# Patient Record
Sex: Female | Born: 1975
Health system: Southern US, Community
[De-identification: ages and names within clinical notes are randomized; demographics above are authoritative.]

## PROBLEM LIST (undated history)

## (undated) DIAGNOSIS — E785 Hyperlipidemia, unspecified: Secondary | ICD-10-CM

## (undated) HISTORY — DX: Hyperlipidemia, unspecified: E78.5

## (undated) HISTORY — PX: NO PAST SURGERIES: SHX2092

---

## 2016-11-07 DIAGNOSIS — L239 Allergic contact dermatitis, unspecified cause: Secondary | ICD-10-CM | POA: Diagnosis not present

## 2016-11-18 DIAGNOSIS — Z0000 Encounter for general adult medical examination without abnormal findings: Secondary | ICD-10-CM | POA: Diagnosis not present

## 2018-01-31 DIAGNOSIS — Z23 Encounter for immunization: Secondary | ICD-10-CM | POA: Diagnosis not present

## 2018-03-24 ENCOUNTER — Encounter: Payer: Self-pay | Admitting: Obstetrics and Gynecology

## 2018-04-04 ENCOUNTER — Encounter: Payer: Self-pay | Admitting: Sports Medicine

## 2018-04-04 ENCOUNTER — Ambulatory Visit (INDEPENDENT_AMBULATORY_CARE_PROVIDER_SITE_OTHER): Payer: BLUE CROSS/BLUE SHIELD | Admitting: Sports Medicine

## 2018-04-04 DIAGNOSIS — M722 Plantar fascial fibromatosis: Secondary | ICD-10-CM | POA: Diagnosis not present

## 2018-04-04 MED ORDER — MELOXICAM 15 MG PO TABS: ORAL_TABLET | ORAL | 3 refills | Status: DC

## 2018-04-04 NOTE — Progress Notes (Signed)
Subjective:    CC: Right foot pain  HPI:  This is a pleasant 42 year old female, she has been trying to run more more.  She does walk barefoot at home, for the past few weeks to months she is starting to have pain in the plantar aspect of her right heel, moderate, persistent, localized without radiation, not necessarily worse with the first few steps in the morning.  I reviewed the past medical history, family history, social history, surgical history, and allergies today and no changes were needed.  Please see the problem list section below in epic for further details.  Past Medical History: History reviewed. No pertinent past medical history. Past Surgical History: Past Surgical History:  Procedure Laterality Date  . NO PAST SURGERIES     Social History: Social History   Socioeconomic History  . Marital status: Married    Spouse name: Not on file  . Number of children: Not on file  . Years of education: Not on file  . Highest education level: Not on file  Occupational History  . Not on file  Social Needs  . Financial resource strain: Not on file  . Food insecurity:    Worry: Not on file    Inability: Not on file  . Transportation needs:    Medical: Not on file    Non-medical: Not on file  Tobacco Use  . Smoking status: Never Smoker  . Smokeless tobacco: Never Used  Substance and Sexual Activity  . Alcohol use: Not Currently  . Drug use: Never  . Sexual activity: Yes  Lifestyle  . Physical activity:    Days per week: Not on file    Minutes per session: Not on file  . Stress: Not on file  Relationships  . Social connections:    Talks on phone: Not on file    Gets together: Not on file    Attends religious service: Not on file    Active member of club or organization: Not on file    Attends meetings of clubs or organizations: Not on file    Relationship status: Not on file  Other Topics Concern  . Not on file  Social History Narrative  . Not on file    Family History: Family History  Problem Relation Age of Onset  . Cancer Maternal Uncle        colon  . Cancer Maternal Grandfather        kidney   Allergies: No Known Allergies Medications: See med rec.  Review of Systems: No headache, visual changes, nausea, vomiting, diarrhea, constipation, dizziness, abdominal pain, skin rash, fevers, chills, night sweats, swollen lymph nodes, weight loss, chest pain, body aches, joint swelling, muscle aches, shortness of breath, mood changes, visual or auditory hallucinations.  Objective:    General: Well Developed, well nourished, and in no acute distress.  Neuro: Alert and oriented x3, extra-ocular muscles intact, sensation grossly intact.  HEENT: Normocephalic, atraumatic, pupils equal round reactive to light, neck supple, no masses, no lymphadenopathy, thyroid nonpalpable.  Skin: Warm and dry, no rashes noted.  Cardiac: Regular rate and rhythm, no murmurs rubs or gallops.  Respiratory: Clear to auscultation bilaterally. Not using accessory muscles, speaking in full sentences.  Abdominal: Soft, nontender, nondistended, positive bowel sounds, no masses, no organomegaly.  Right foot: No visible erythema or swelling. Range of motion is full in all directions. Strength is 5/5 in all directions. No hallux valgus. No pes cavus or pes planus. No abnormal callus noted. No pain  over the navicular prominence, or base of fifth metatarsal. Mild tenderness to palpation of the calcaneal insertion of plantar fascia. No pain at the Achilles insertion. No pain over the calcaneal bursa. No pain of the retrocalcaneal bursa. No tenderness to palpation over the tarsals, metatarsals, or phalanges. No hallux rigidus or limitus. No tenderness palpation over interphalangeal joints. No pain with compression of the metatarsal heads. Neurovascularly intact distally.  Impression and Recommendations:    The patient was counselled, risk factors were discussed,  anticipatory guidance given.  Plantar fasciitis, right Meloxicam, rehab exercises given. Avoid barefoot walking. Return for custom molded orthotics. Currently stopped running but would like to get back up to 5 to 10 miles per week. ___________________________________________ Gwen Her. Dianah Field, M.D., ABFM., CAQSM. Primary Care and Sports Medicine [facility]  Adjunct Professor of [place] of [facility] of Medicine

## 2018-04-04 NOTE — Assessment & Plan Note (Signed)
Meloxicam, rehab exercises given. Avoid barefoot walking. Return for custom molded orthotics. Currently stopped running but would like to get back up to 5 to 10 miles per week.

## 2018-04-19 ENCOUNTER — Ambulatory Visit (INDEPENDENT_AMBULATORY_CARE_PROVIDER_SITE_OTHER): Payer: BLUE CROSS/BLUE SHIELD | Admitting: Sports Medicine

## 2018-04-19 ENCOUNTER — Encounter: Payer: Self-pay | Admitting: Sports Medicine

## 2018-04-19 DIAGNOSIS — M722 Plantar fascial fibromatosis: Secondary | ICD-10-CM | POA: Diagnosis not present

## 2018-04-19 NOTE — Progress Notes (Signed)
    Patient was fitted for a : standard, cushioned, semi-rigid orthotic. The orthotic was heated and afterward the patient stood on the orthotic blank positioned on the orthotic stand. The patient was positioned in subtalar neutral position and 10 degrees of ankle dorsiflexion in a weight bearing stance. After completion of molding, a stable base was applied to the orthotic blank. The blank was ground to a stable position for weight bearing. Size: 6 Base: [employer] and safety inspector and [place]adding: None The patient ambulated these, and they were very comfortable.  I spent 40 minutes with this patient, greater than 50% was face-to-face time counseling regarding the below diagnosis, specifically diagnosis and treatment options for plantar fasciitis.  ___________________________________________ Gwen Her. Dianah Field, M.D., ABFM., CAQSM. Primary Care and [place] Instructor of [place] of [facility] of Medicine

## 2018-04-19 NOTE — Assessment & Plan Note (Signed)
Continue meloxicam, avoid barefoot walking, custom orthotics today. Goal is to get back to 5 to 10 miles per week of running. Feeling better with the stretches and the meloxicam during the morning but significant pain during the day.

## 2018-04-22 ENCOUNTER — Other Ambulatory Visit: Payer: Self-pay

## 2018-04-22 ENCOUNTER — Other Ambulatory Visit (HOSPITAL_COMMUNITY)
Admission: RE | Admit: 2018-04-22 | Discharge: 2018-04-22 | Disposition: A | Discharge status: Home / Self Care | Payer: BLUE CROSS/BLUE SHIELD | Source: Ambulatory Visit | Attending: Obstetrics and Gynecology | Admitting: Obstetrics and Gynecology

## 2018-04-22 ENCOUNTER — Encounter: Payer: Self-pay | Admitting: Obstetrics and Gynecology

## 2018-04-22 ENCOUNTER — Ambulatory Visit (INDEPENDENT_AMBULATORY_CARE_PROVIDER_SITE_OTHER): Payer: BLUE CROSS/BLUE SHIELD | Admitting: Obstetrics and Gynecology

## 2018-04-22 VITALS — BP 108/60 | HR 72 | Resp 12 | Ht 62.0 in | Wt 126.0 lb

## 2018-04-22 DIAGNOSIS — D229 Melanocytic nevi, unspecified: Secondary | ICD-10-CM | POA: Diagnosis not present

## 2018-04-22 DIAGNOSIS — Z01419 Encounter for gynecological examination (general) (routine) without abnormal findings: Secondary | ICD-10-CM | POA: Diagnosis not present

## 2018-04-22 NOTE — Patient Instructions (Addendum)

## 2018-04-22 NOTE — Progress Notes (Signed)
43 y.o. G0P0000 Married Asian/Brazilian female here as a new patient for an annual exam. Patient moved from [place] April 2018.  No missed menses.  No hot flashes.  No pain with menstruation.   Hx low vit D.   Did 2 IVF in [place].  Declines future childbearing.   From [place].  Husband works for [employer]   PCP:   No PCP  Patient's last menstrual period was 04/01/2018.     Period Cycle (Days): 28 Period Duration (Days): 4-5 Period Pattern: Regular Menstrual Flow: Moderate Menstrual Control: Tampon, Maxi pad Menstrual Control Change Freq (Hours): 8 Dysmenorrhea: (!) Mild Dysmenorrhea Symptoms: (crave sweets, breast tenderness and back pain)     Sexually active: Yes.    The current method of family planning is none.    Exercising: Yes.    cycle class and orange theory Smoker:  no  Health Maintenance: Pap:  2017 done in [place] - normal per patient  History of abnormal Pap:  no MMG:  2017 normal per patient -- patient states that she has dense breasts Colonoscopy:  n/a BMD:   n/a  Result  n/a TDaP:  unsure Gardasil:   no HIV and Hep C: negative in the past per patient Screening Labs:  Discuss today.  Flu vaccine:  Done.    reports that she has never smoked. She has never used smokeless tobacco. She reports previous alcohol use. She reports that she does not use drugs.  History reviewed. No pertinent past medical history.  Past Surgical History:  Procedure Laterality Date  . NO PAST SURGERIES      Current Outpatient Medications  Medication Sig Dispense Refill  . CHOLECALCIFEROL PO Take by mouth. Take 10,000 IU twice weekly    . meloxicam (MOBIC) 15 MG tablet One tab PO qAM with breakfast for 2 weeks, then daily prn pain. 30 tablet 3  . Misc Natural Products (OSTEO BI-FLEX JOINT SHIELD PO) Take by mouth. Take 1500mg  once daily     No current facility-administered medications for this visit.     Family History  Problem Relation Age of Onset  . Cancer Maternal  Uncle        colon  . Cancer Maternal Grandfather        kidney  . Diabetes Mother   . Osteoporosis Mother   . Heart attack Father   . Hyperlipidemia Father   . Diabetes Father   . Breast cancer Paternal Aunt   . Aneurysm Paternal Uncle   . Brain cancer Maternal Grandmother   . Anuerysm Paternal Grandmother     Review of Systems  Constitutional: Negative.   HENT: Negative.   Eyes: Negative.   Respiratory: Negative.   Cardiovascular: Negative.   Gastrointestinal: Negative.   Endocrine: Negative.   Genitourinary: Negative.   Musculoskeletal: Negative.   Skin: Negative.   Allergic/Immunologic: Negative.   Neurological: Negative.   Hematological: Negative.   Psychiatric/Behavioral: Negative.     Exam:   BP 108/60 (BP Location: Right Arm, Patient Position: Sitting, Cuff Size: Normal)   Pulse 72   Resp 12   Ht 5\' 2"  (1.575 m)   Wt 126 lb (57.2 kg)   LMP 04/01/2018   BMI 23.05 kg/m     General appearance: alert, cooperative and appears stated age Head: Normocephalic, without obvious abnormality, atraumatic Neck: no adenopathy, supple, symmetrical, trachea midline and thyroid normal to inspection and palpation Lungs: clear to auscultation bilaterally Breasts: normal appearance, no masses or tenderness, No nipple retraction or dimpling,  No nipple discharge or bleeding, No axillary or supraclavicular adenopathy Heart: regular rate and rhythm Abdomen: soft, non-tender; no masses, no organomegaly Extremities: extremities normal, atraumatic, no cyanosis or edema Skin: Skin color, texture, turgor normal. No rashes or lesions Lymph nodes: Cervical, supraclavicular, and axillary nodes normal. No abnormal inguinal nodes palpated Neurologic: Grossly normal  Pelvic: External genitalia:   3 - 4 mm irregular raised pigmented nevus of the left vulva (new per patient).              Urethra:  normal appearing urethra with no masses, tenderness or lesions              Bartholins and  Skenes: normal                 Vagina: normal appearing vagina with normal color and discharge, no lesions              Cervix: no lesions              Pap taken: Yes.   Bimanual Exam:  Uterus:  normal size, contour, position, consistency, mobility, non-tender              Adnexa: no mass, fullness, tenderness              Rectal exam: Yes.  .  Confirms.              Anus:  normal sphincter tone, no lesions  Chaperone was present for exam.  Assessment:   Well woman visit with normal exam. Hx infertility.  Hx low vit D.  Pigmented nevus.   Plan: Mammogram screening.  Locations discussed.  Recommended self breast awareness. Pap and HR HPV as above. Guidelines for Calcium, Vitamin D, regular exercise program including cardiovascular and weight bearing exercise. Routine labs.  Discussed Gardasil vaccine.  She declines for now.  Return for vulvar biopsy.  Follow up annually and prn.   After visit summary provided.

## 2018-04-23 LAB — COMPREHENSIVE METABOLIC PANEL
ALT: 11 IU/L (ref 0–32)
AST: 16 IU/L (ref 0–40)
Albumin/Globulin Ratio: 2.1 (ref 1.2–2.2)
Albumin: 4.8 g/dL (ref 3.5–5.5)
Alkaline Phosphatase: 61 IU/L (ref 39–117)
BUN/Creatinine Ratio: 12 (ref 9–23)
BUN: 8 mg/dL (ref 6–24)
Bilirubin Total: 0.8 mg/dL (ref 0.0–1.2)
CO2: 17 mmol/L — ABNORMAL LOW (ref 20–29)
Calcium: 9.9 mg/dL (ref 8.7–10.2)
Chloride: 102 mmol/L (ref 96–106)
Creatinine, Ser: 0.68 mg/dL (ref 0.57–1.00)
GFR calc Af Amer: 124 mL/min/{1.73_m2} (ref >59)
GFR calc non Af Amer: 107 mL/min/{1.73_m2} (ref >59)
Globulin, Total: 2.3 g/dL (ref 1.5–4.5)
Glucose: 83 mg/dL (ref 65–99)
Potassium: 4 mmol/L (ref 3.5–5.2)
Sodium: 138 mmol/L (ref 134–144)
Total Protein: 7.1 g/dL (ref 6.0–8.5)

## 2018-04-23 LAB — LIPID PANEL
Chol/HDL Ratio: 2.3 ratio (ref 0.0–4.4)
Cholesterol, Total: 233 mg/dL — ABNORMAL HIGH (ref 100–199)
HDL: 102 mg/dL (ref >39)
LDL Calculated: 116 mg/dL — ABNORMAL HIGH (ref 0–99)
Triglycerides: 74 mg/dL (ref 0–149)
VLDL Cholesterol Cal: 15 mg/dL (ref 5–40)

## 2018-04-23 LAB — CBC
Hematocrit: 38.2 % (ref 34.0–46.6)
Hemoglobin: 12.5 g/dL (ref 11.1–15.9)
MCH: 29.8 pg (ref 26.6–33.0)
MCHC: 32.7 g/dL (ref 31.5–35.7)
MCV: 91 fL (ref 79–97)
Platelets: 301 10*3/uL (ref 150–450)
RBC: 4.19 x10E6/uL (ref 3.77–5.28)
RDW: 12.9 % (ref 12.3–15.4)
WBC: 8.3 10*3/uL (ref 3.4–10.8)

## 2018-04-23 LAB — TSH: TSH: 1.36 u[IU]/mL (ref 0.450–4.500)

## 2018-04-23 LAB — VITAMIN D 25 HYDROXY (VIT D DEFICIENCY, FRACTURES): Vit D, 25-Hydroxy: 32 ng/mL (ref 30.0–100.0)

## 2018-04-26 ENCOUNTER — Other Ambulatory Visit: Payer: Self-pay | Admitting: Obstetrics and Gynecology

## 2018-04-26 DIAGNOSIS — Z1231 Encounter for screening mammogram for malignant neoplasm of breast: Secondary | ICD-10-CM

## 2018-04-26 LAB — CYTOLOGY - PAP
Diagnosis: NEGATIVE
HPV: NOT DETECTED

## 2018-04-27 ENCOUNTER — Telehealth: Payer: Self-pay | Admitting: *Deleted

## 2018-04-27 NOTE — Telephone Encounter (Signed)
-----   Message from Nunzio Cobbs, MD sent at 04/26/2018  3:24 PM EST ----- Please contact patient with results of testing.  You may need an interpretor to assist.  You may try to call her first to ask if she needs an interpretor.  [place] is her primary language.   Her pap and high risk HPV testing are normal and negative.  Pap recall - 02.  Her cholesterol looks good.  The total cholesterol and the LDL cholesterol are elevated, but the ratios are excellent due to her high HDL cholesterol.     Her metabolic profile shows a slightly low CO2, which is not of concern.  The rest of the metabolic profile was normal.   Her thyroid, vit D, and blood counts are all normal.

## 2018-04-27 NOTE — Telephone Encounter (Signed)
Patient returning call to Jill, RN.  °

## 2018-04-27 NOTE — Telephone Encounter (Signed)
Spoke with patient, patient declines interpretor, will advise if needed after results reviewed. Advised as seen below per Dr. Quincy Simmonds. Patient verbalizes understanding and is agreeable.    Encounter closed.

## 2018-04-27 NOTE — Telephone Encounter (Signed)
Notes recorded by Burnice Logan, RN on 04/27/2018 at 10:27 AM EST No answer, automated message sates " wireless customer not available", no option to leave voicemail. ------

## 2018-05-02 ENCOUNTER — Ambulatory Visit
Admission: RE | Admit: 2018-05-02 | Discharge: 2018-05-02 | Disposition: A | Discharge status: Home / Self Care | Payer: BLUE CROSS/BLUE SHIELD | Source: Ambulatory Visit | Attending: Obstetrics and Gynecology | Admitting: Obstetrics and Gynecology

## 2018-05-02 DIAGNOSIS — Z1231 Encounter for screening mammogram for malignant neoplasm of breast: Secondary | ICD-10-CM

## 2018-05-04 ENCOUNTER — Other Ambulatory Visit: Payer: Self-pay | Admitting: Behavioral Health

## 2018-05-04 ENCOUNTER — Other Ambulatory Visit: Payer: Self-pay | Admitting: Obstetrics and Gynecology

## 2018-05-04 DIAGNOSIS — R928 Other abnormal and inconclusive findings on diagnostic imaging of breast: Secondary | ICD-10-CM

## 2018-05-06 ENCOUNTER — Other Ambulatory Visit: Payer: Self-pay

## 2018-05-06 ENCOUNTER — Ambulatory Visit (INDEPENDENT_AMBULATORY_CARE_PROVIDER_SITE_OTHER): Payer: BLUE CROSS/BLUE SHIELD | Admitting: Obstetrics and Gynecology

## 2018-05-06 ENCOUNTER — Encounter: Payer: Self-pay | Admitting: Obstetrics and Gynecology

## 2018-05-06 VITALS — BP 110/66 | HR 66 | Ht 62.0 in | Wt 125.6 lb

## 2018-05-06 DIAGNOSIS — Z01812 Encounter for preprocedural laboratory examination: Secondary | ICD-10-CM

## 2018-05-06 DIAGNOSIS — D229 Melanocytic nevi, unspecified: Secondary | ICD-10-CM

## 2018-05-06 DIAGNOSIS — D280 Benign neoplasm of vulva: Secondary | ICD-10-CM | POA: Diagnosis not present

## 2018-05-06 LAB — POCT URINE PREGNANCY: PREG TEST UR: NEGATIVE

## 2018-05-06 NOTE — Patient Instructions (Signed)
Vulva Biopsy, Care After  This sheet gives you information about how to care for yourself after your procedure. Your health care provider may also give you more specific instructions. If you have problems or questions, contact your health care provider.  What can I expect after the procedure?  After the procedure, it is common to have:   Slight bleeding from the biopsy site.   Discomfort at the biopsy site.  Follow these instructions at home:  Biopsy site care     Follow instructions from your health care provider about how to take care of your biopsy site. Make sure you:  ? Clean the area using water and mild soap twice a day or as told by your health care provider. Gently pat the area dry.  ? If you were prescribed an antibiotic ointment, apply it as told by your health care provider. Do not stop using the antibiotic even if your condition improves.  ? Take a warm water bath (sitz bath) as needed to help with pain and discomfort. A sitz bath is taken while you are sitting down. The water should only come up to your hips and should cover your buttocks.  ? Leave stitches (sutures), skin glue, or adhesive strips in place. These skin closures may need to stay in place for 2 weeks or longer. If adhesive strip edges start to loosen and curl up, you may trim the loose edges. Do not remove adhesive strips completely unless your health care provider tells you to do that.   Check your biopsy site every day for signs of infection. Check for:  ? More redness, swelling, or pain.  ? More fluid or blood.  ? Warmth.  ? Pus or a bad smell.   Do not rub the biopsy area after urinating. Gently pat the area dry or use a bottle filled with warm water (peri-bottle) to clean the area. Gently wipe from front to back.  Lifestyle   Wear loose, cotton underwear. Do not wear tight pants.   Do not use a tampon, douche, or put anything inside your vagina for at least 1 week or until your health care provider approves.   Do not have sex  for at least 1 week or until your health care provider approves.   Do not exercise, such as running or biking, until your health care provider approves.   Do not swim or use a hot tub until your health care provider approves. You may shower or take a sitz bath.  General instructions   Take over-the-counter and prescription medicines only as told by your health care provider.   Use a sanitary napkin until the bleeding stops.   Keep all follow-up visits as told by your health care provider. This is important.  Contact a health care provider if:   You have more redness, swelling, or pain around your biopsy site.   You have more fluid or blood coming from your biopsy site.   Your biopsy site feels warm to the touch.   Your pain is not controlled with medicine.  Get help right away if you have:   Heavy bleeding from the vulva.   Pus or a bad smell coming from your biopsy site.   A fever.   Lower abdominal pain.  Summary   After the procedure, it is common to have slight bleeding and discomfort at the biopsy site.   Follow instructions from your health care provider after your biopsy. Make sure you clean the area with   water and mild soap. Pat the area dry.   Take sitz baths as needed to help with pain and discomfort. Leave any sutures in place.   Check your biopsy site for signs of infection, which may include more redness, swelling, pain, fluid, or blood, or feeling warm to the touch.   Get help right away if you have heavy bleeding, a fever, pus or a bad smell, or pain in the lower abdomen.  This information is not intended to replace advice given to you by your health care provider. Make sure you discuss any questions you have with your health care provider.  Document Released: 03/23/2012 Document Revised: 10/07/2017 Document Reviewed: 10/07/2017  Elsevier Interactive Patient Education  2019 Elsevier Inc.

## 2018-05-06 NOTE — Progress Notes (Signed)
  Subjective:     Patient ID: Heidi Daniels, female   DOB: 1975/04/26, 43 y.o.   MRN: [id number]  HPI  Patient here today for vulvar biopsy of pigmented nevus.   Review of Systems  LMP: 04-28-2018 Contraception:None UPT: Neg      Objective:   Physical Exam Left vulva with 5 - 6 mm spreading dark brown pigmented and slightly raised nevus.  Procedure Consent for vulvar biopsy.  Sterile prep with betadine and then alcohol wipe. Local 1% lidocaine - lot [id number], exp 4/23. 4 mm punch biopsy done.  Most but not all of the lesion included.  Tissue to pathology.  Single suture of 3/0 Vicryl used.  Minimal EBL.  No complications.     Assessment:     Vulvar pigmented nevus.    Plan:     FU biopsy result.  Biopsy precautions given.   After visit summary to patient.

## 2018-05-09 ENCOUNTER — Other Ambulatory Visit: Payer: Self-pay | Admitting: Obstetrics and Gynecology

## 2018-05-09 ENCOUNTER — Ambulatory Visit
Admission: RE | Admit: 2018-05-09 | Discharge: 2018-05-09 | Disposition: A | Discharge status: Home / Self Care | Payer: BLUE CROSS/BLUE SHIELD | Source: Ambulatory Visit | Attending: Obstetrics and Gynecology | Admitting: Obstetrics and Gynecology

## 2018-05-09 DIAGNOSIS — R928 Other abnormal and inconclusive findings on diagnostic imaging of breast: Secondary | ICD-10-CM

## 2018-05-09 DIAGNOSIS — R921 Mammographic calcification found on diagnostic imaging of breast: Secondary | ICD-10-CM | POA: Diagnosis not present

## 2018-05-11 ENCOUNTER — Telehealth: Payer: Self-pay | Admitting: *Deleted

## 2018-05-11 ENCOUNTER — Other Ambulatory Visit: Payer: Self-pay | Admitting: Obstetrics and Gynecology

## 2018-05-11 DIAGNOSIS — R921 Mammographic calcification found on diagnostic imaging of breast: Secondary | ICD-10-CM

## 2018-05-11 NOTE — Telephone Encounter (Signed)
-----   Message from Nunzio Cobbs, MD sent at 05/10/2018  9:25 PM EST ----- Please share pathology report showing a benign nevus of the vulvar skin.  No abnormal cells were seen.

## 2018-05-11 NOTE — Telephone Encounter (Signed)
Notes recorded by Burnice Logan, RN on 05/11/2018 at 9:45 AM EST No answer, no option to leave voicemail. ------

## 2018-05-17 NOTE — Telephone Encounter (Signed)
Spoke with patient, advised as seen below per Dr. Silva.  Patient verbalizes understanding and is agreeable.  Encounter closed.  

## 2018-05-19 ENCOUNTER — Ambulatory Visit: Payer: BLUE CROSS/BLUE SHIELD | Admitting: Sports Medicine

## 2018-06-14 DIAGNOSIS — M9902 Segmental and somatic dysfunction of thoracic region: Secondary | ICD-10-CM | POA: Diagnosis not present

## 2018-06-14 DIAGNOSIS — M9903 Segmental and somatic dysfunction of lumbar region: Secondary | ICD-10-CM | POA: Diagnosis not present

## 2018-06-14 DIAGNOSIS — M9905 Segmental and somatic dysfunction of pelvic region: Secondary | ICD-10-CM | POA: Diagnosis not present

## 2018-06-14 DIAGNOSIS — M9901 Segmental and somatic dysfunction of cervical region: Secondary | ICD-10-CM | POA: Diagnosis not present

## 2018-06-21 DIAGNOSIS — M9905 Segmental and somatic dysfunction of pelvic region: Secondary | ICD-10-CM | POA: Diagnosis not present

## 2018-06-21 DIAGNOSIS — M9902 Segmental and somatic dysfunction of thoracic region: Secondary | ICD-10-CM | POA: Diagnosis not present

## 2018-06-21 DIAGNOSIS — M9901 Segmental and somatic dysfunction of cervical region: Secondary | ICD-10-CM | POA: Diagnosis not present

## 2018-06-21 DIAGNOSIS — M9903 Segmental and somatic dysfunction of lumbar region: Secondary | ICD-10-CM | POA: Diagnosis not present

## 2018-07-01 DIAGNOSIS — M9902 Segmental and somatic dysfunction of thoracic region: Secondary | ICD-10-CM | POA: Diagnosis not present

## 2018-07-01 DIAGNOSIS — M9903 Segmental and somatic dysfunction of lumbar region: Secondary | ICD-10-CM | POA: Diagnosis not present

## 2018-07-01 DIAGNOSIS — M9901 Segmental and somatic dysfunction of cervical region: Secondary | ICD-10-CM | POA: Diagnosis not present

## 2018-07-01 DIAGNOSIS — M9905 Segmental and somatic dysfunction of pelvic region: Secondary | ICD-10-CM | POA: Diagnosis not present

## 2018-11-08 ENCOUNTER — Other Ambulatory Visit: Payer: Self-pay | Admitting: Obstetrics and Gynecology

## 2018-11-08 ENCOUNTER — Ambulatory Visit
Admission: RE | Admit: 2018-11-08 | Discharge: 2018-11-08 | Disposition: A | Discharge status: Home / Self Care | Payer: BC Managed Care – PPO | Source: Ambulatory Visit | Attending: Obstetrics and Gynecology | Admitting: Obstetrics and Gynecology

## 2018-11-08 ENCOUNTER — Other Ambulatory Visit: Payer: Self-pay

## 2018-11-08 DIAGNOSIS — R921 Mammographic calcification found on diagnostic imaging of breast: Secondary | ICD-10-CM

## 2018-12-09 DIAGNOSIS — M533 Sacrococcygeal disorders, not elsewhere classified: Secondary | ICD-10-CM | POA: Diagnosis not present

## 2018-12-12 DIAGNOSIS — M545 Low back pain: Secondary | ICD-10-CM | POA: Diagnosis not present

## 2018-12-12 DIAGNOSIS — S335XXD Sprain of ligaments of lumbar spine, subsequent encounter: Secondary | ICD-10-CM | POA: Diagnosis not present

## 2018-12-14 DIAGNOSIS — S335XXD Sprain of ligaments of lumbar spine, subsequent encounter: Secondary | ICD-10-CM | POA: Diagnosis not present

## 2018-12-14 DIAGNOSIS — M545 Low back pain: Secondary | ICD-10-CM | POA: Diagnosis not present

## 2018-12-20 DIAGNOSIS — S335XXD Sprain of ligaments of lumbar spine, subsequent encounter: Secondary | ICD-10-CM | POA: Diagnosis not present

## 2018-12-20 DIAGNOSIS — M545 Low back pain: Secondary | ICD-10-CM | POA: Diagnosis not present

## 2018-12-23 DIAGNOSIS — S335XXD Sprain of ligaments of lumbar spine, subsequent encounter: Secondary | ICD-10-CM | POA: Diagnosis not present

## 2018-12-23 DIAGNOSIS — M545 Low back pain: Secondary | ICD-10-CM | POA: Diagnosis not present

## 2018-12-27 DIAGNOSIS — S335XXD Sprain of ligaments of lumbar spine, subsequent encounter: Secondary | ICD-10-CM | POA: Diagnosis not present

## 2018-12-27 DIAGNOSIS — M545 Low back pain: Secondary | ICD-10-CM | POA: Diagnosis not present

## 2018-12-30 DIAGNOSIS — M545 Low back pain: Secondary | ICD-10-CM | POA: Diagnosis not present

## 2018-12-30 DIAGNOSIS — S335XXD Sprain of ligaments of lumbar spine, subsequent encounter: Secondary | ICD-10-CM | POA: Diagnosis not present

## 2019-01-03 DIAGNOSIS — S335XXD Sprain of ligaments of lumbar spine, subsequent encounter: Secondary | ICD-10-CM | POA: Diagnosis not present

## 2019-01-03 DIAGNOSIS — M545 Low back pain: Secondary | ICD-10-CM | POA: Diagnosis not present

## 2019-01-10 DIAGNOSIS — M545 Low back pain: Secondary | ICD-10-CM | POA: Diagnosis not present

## 2019-01-10 DIAGNOSIS — S335XXD Sprain of ligaments of lumbar spine, subsequent encounter: Secondary | ICD-10-CM | POA: Diagnosis not present

## 2019-03-20 DIAGNOSIS — Z03818 Encounter for observation for suspected exposure to other biological agents ruled out: Secondary | ICD-10-CM | POA: Diagnosis not present

## 2019-05-15 ENCOUNTER — Other Ambulatory Visit: Payer: Self-pay

## 2019-05-15 ENCOUNTER — Ambulatory Visit
Admission: RE | Admit: 2019-05-15 | Discharge: 2019-05-15 | Disposition: A | Discharge status: Home / Self Care | Payer: BC Managed Care – PPO | Source: Ambulatory Visit | Attending: Obstetrics and Gynecology | Admitting: Obstetrics and Gynecology

## 2019-05-15 DIAGNOSIS — R921 Mammographic calcification found on diagnostic imaging of breast: Secondary | ICD-10-CM | POA: Diagnosis not present

## 2019-05-24 ENCOUNTER — Encounter: Payer: Self-pay | Admitting: Obstetrics and Gynecology

## 2019-05-24 ENCOUNTER — Ambulatory Visit (INDEPENDENT_AMBULATORY_CARE_PROVIDER_SITE_OTHER): Payer: BC Managed Care – PPO | Admitting: Obstetrics and Gynecology

## 2019-05-24 ENCOUNTER — Other Ambulatory Visit: Payer: Self-pay

## 2019-05-24 VITALS — BP 100/66 | HR 76 | Temp 97.5°F | Resp 12 | Ht 61.0 in | Wt 128.6 lb

## 2019-05-24 DIAGNOSIS — R7989 Other specified abnormal findings of blood chemistry: Secondary | ICD-10-CM | POA: Diagnosis not present

## 2019-05-24 DIAGNOSIS — Z01419 Encounter for gynecological examination (general) (routine) without abnormal findings: Secondary | ICD-10-CM

## 2019-05-24 DIAGNOSIS — Z809 Family history of malignant neoplasm, unspecified: Secondary | ICD-10-CM | POA: Diagnosis not present

## 2019-05-24 NOTE — Patient Instructions (Signed)

## 2019-05-24 NOTE — Progress Notes (Signed)
44 y.o. G0P0000 Married Asian/Brazilian female here for annual exam.    She is concerned that under her right arm she has sensitivity with massage.   Moved at the end of the year.   Regular menses.  No cramping.  Flow is heavy for the first day.   She is worried about cancer in her family.   PCP: None    Patient's last menstrual period was 05/14/2019 (approximate).           Sexually active: Yes.    The current method of family planning is none.    Exercising: Yes.    HIT Smoker:  no  Health Maintenance: Pap: 04-22-18 Neg:Neg HR [id number] normal per patient in [place] History of abnormal Pap:  no MMG: 05-15-19 Diag.Bil./Neg/a few scattered calcifications are again noted in upper-inner quadrant of Lt.Br.that are stable from prior exams/Bil.Diag.in 8yr.rec.to document stability of calcifications in Lt.Br. for 2 yrs./BiRads3 Colonoscopy:  n/a BMD:   n/a  Result  n/a TDaP: Unsure Gardasil:   no HIV:Neg in the past per patient Hep C:Neg in the past per patient Screening Labs:  Today.    reports that she has never smoked. She has never used smokeless tobacco. She reports previous alcohol use. She reports that she does not use drugs.  History reviewed. No pertinent past medical history.  Past Surgical History:  Procedure Laterality Date  . NO PAST SURGERIES      Current Outpatient Medications  Medication Sig Dispense Refill  . CHOLECALCIFEROL PO Take by mouth. Take 10,000 IU twice weekly     No current facility-administered medications for this visit.    Family History  Problem Relation Age of Onset  . Cancer Maternal Uncle        colon  . Cancer Maternal Grandfather        kidney  . Diabetes Mother   . Osteoporosis Mother   . Heart attack Father   . Hyperlipidemia Father   . Diabetes Father   . Breast cancer Paternal Aunt   . Aneurysm Paternal Uncle   . Brain cancer Maternal Grandmother   . Anuerysm Paternal Grandmother     Review of Systems  See HPI.  Exam:    BP 100/66   Pulse 76   Temp (!) 97.5 F (36.4 C) (Temporal)   Resp 12   Ht 5\' 1"  (1.549 m)   Wt 128 lb 9.6 oz (58.3 kg)   LMP 05/14/2019 (Approximate)   BMI 24.30 kg/m     General appearance: alert, cooperative and appears stated age Head: normocephalic, without obvious abnormality, atraumatic Neck: no adenopathy, supple, symmetrical, trachea midline and thyroid normal to inspection and palpation Lungs: clear to auscultation bilaterally Breasts: normal appearance, no masses or tenderness, No nipple retraction or dimpling, No nipple discharge or bleeding, No axillary adenopathy Heart: regular rate and rhythm Abdomen: soft, non-tender; no masses, no organomegaly Extremities: extremities normal, atraumatic, no cyanosis or edema Skin: skin color, texture, turgor normal. No rashes or lesions Lymph nodes: cervical, supraclavicular, and axillary nodes normal. Neurologic: grossly normal  Pelvic: External genitalia:  no lesions              No abnormal inguinal nodes palpated.              Urethra:  normal appearing urethra with no masses, tenderness or lesions              Bartholins and Skenes: normal  Vagina: normal appearing vagina with normal color and discharge, no lesions              Cervix: no lesions              Pap taken:  No.  Bimanual Exam:  Uterus:  normal size, contour, position, consistency, mobility, non-tender              Adnexa: no mass, fullness, tenderness              Rectal exam: Yes.  .  Confirms.              Anus:  normal sphincter tone, no lesions  Chaperone was present for exam.  Assessment:   Well woman visit with normal exam. Hx infertility.  Hx low vit D.  FH cancer.   Plan: Mammogram screening discussed. Self breast awareness reviewed. Pap and HR HPV as above. Guidelines for Calcium, Vitamin D, regular exercise program including cardiovascular and weight bearing exercise. Routine labs.  Referral for genetic counseling and  testing.  Follow up annually and prn.   After visit summary provided.

## 2019-05-25 LAB — COMPREHENSIVE METABOLIC PANEL
ALT: 8 IU/L (ref 0–32)
AST: 18 IU/L (ref 0–40)
Albumin/Globulin Ratio: 1.9 (ref 1.2–2.2)
Albumin: 4.6 g/dL (ref 3.8–4.8)
Alkaline Phosphatase: 71 IU/L (ref 39–117)
BUN/Creatinine Ratio: 19 (ref 9–23)
BUN: 14 mg/dL (ref 6–24)
Bilirubin Total: 0.3 mg/dL (ref 0.0–1.2)
CO2: 23 mmol/L (ref 20–29)
Calcium: 9.3 mg/dL (ref 8.7–10.2)
Chloride: 102 mmol/L (ref 96–106)
Creatinine, Ser: 0.72 mg/dL (ref 0.57–1.00)
GFR calc Af Amer: 118 mL/min/{1.73_m2} (ref >59)
GFR calc non Af Amer: 102 mL/min/{1.73_m2} (ref >59)
Globulin, Total: 2.4 g/dL (ref 1.5–4.5)
Glucose: 89 mg/dL (ref 65–99)
Potassium: 3.8 mmol/L (ref 3.5–5.2)
Sodium: 139 mmol/L (ref 134–144)
Total Protein: 7 g/dL (ref 6.0–8.5)

## 2019-05-25 LAB — CBC
Hematocrit: 37.4 % (ref 34.0–46.6)
Hemoglobin: 12.9 g/dL (ref 11.1–15.9)
MCH: 30.6 pg (ref 26.6–33.0)
MCHC: 34.5 g/dL (ref 31.5–35.7)
MCV: 89 fL (ref 79–97)
Platelets: 348 10*3/uL (ref 150–450)
RBC: 4.22 x10E6/uL (ref 3.77–5.28)
RDW: 12.9 % (ref 11.7–15.4)
WBC: 8.3 10*3/uL (ref 3.4–10.8)

## 2019-05-25 LAB — LIPID PANEL
Chol/HDL Ratio: 2.4 ratio (ref 0.0–4.4)
Cholesterol, Total: 236 mg/dL — ABNORMAL HIGH (ref 100–199)
HDL: 100 mg/dL (ref >39)
LDL Chol Calc (NIH): 120 mg/dL — ABNORMAL HIGH (ref 0–99)
Triglycerides: 96 mg/dL (ref 0–149)
VLDL Cholesterol Cal: 16 mg/dL (ref 5–40)

## 2019-05-25 LAB — TSH: TSH: 1.45 u[IU]/mL (ref 0.450–4.500)

## 2019-05-25 LAB — VITAMIN D 25 HYDROXY (VIT D DEFICIENCY, FRACTURES): Vit D, 25-Hydroxy: 23.9 ng/mL — ABNORMAL LOW (ref 30.0–100.0)

## 2019-05-26 NOTE — Addendum Note (Signed)
Addended by: Yisroel Ramming, Dietrich Pates E on: 05/26/2019 12:38 PM   Modules accepted: Orders

## 2019-05-29 ENCOUNTER — Other Ambulatory Visit: Payer: Self-pay

## 2019-05-29 DIAGNOSIS — R7989 Other specified abnormal findings of blood chemistry: Secondary | ICD-10-CM

## 2019-05-29 MED ORDER — VITAMIN D (ERGOCALCIFEROL) 1.25 MG (50000 UNIT) PO CAPS: 50000.0000 [IU] | ORAL_CAPSULE | ORAL | 0 refills | Status: DC

## 2019-06-15 IMAGING — MG DIGITAL SCREENING BILATERAL MAMMOGRAM WITH TOMO AND CAD
8 series · 9 of 24 positions shown · non-contrast
Comparison: No prior films

CLINICAL DATA: Screening.

EXAM:
DIGITAL SCREENING BILATERAL MAMMOGRAM WITH TOMO AND CAD

[L MLO synth-2D]
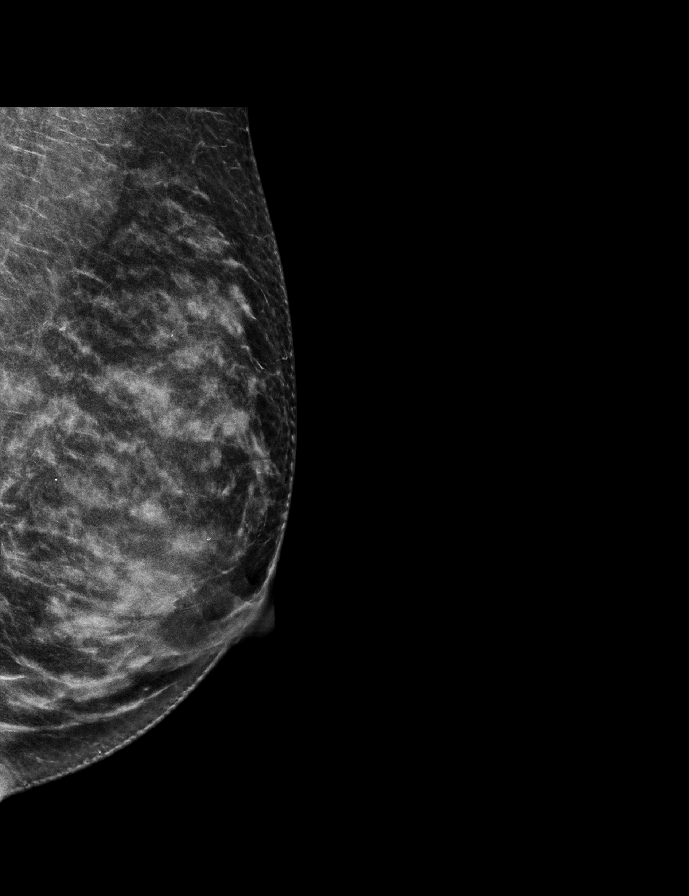

[R MLO synth-2D]
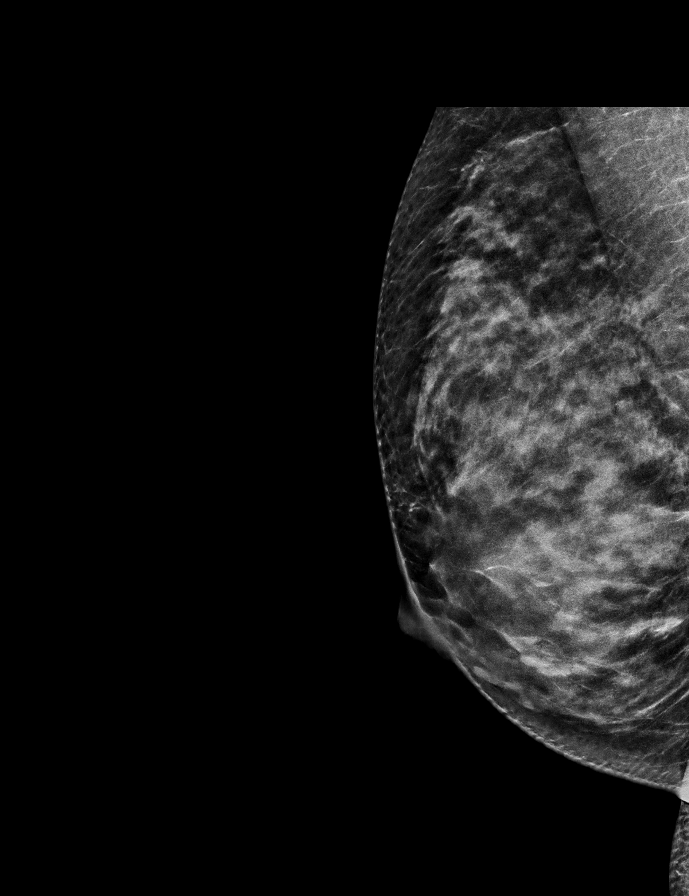

[R CC synth-2D]
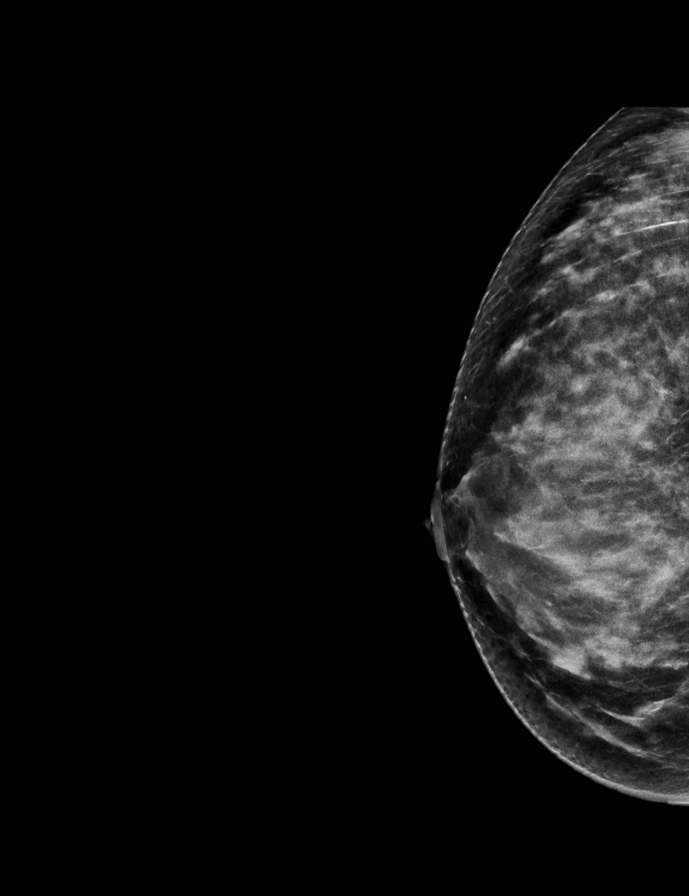

[L CC synth-2D]
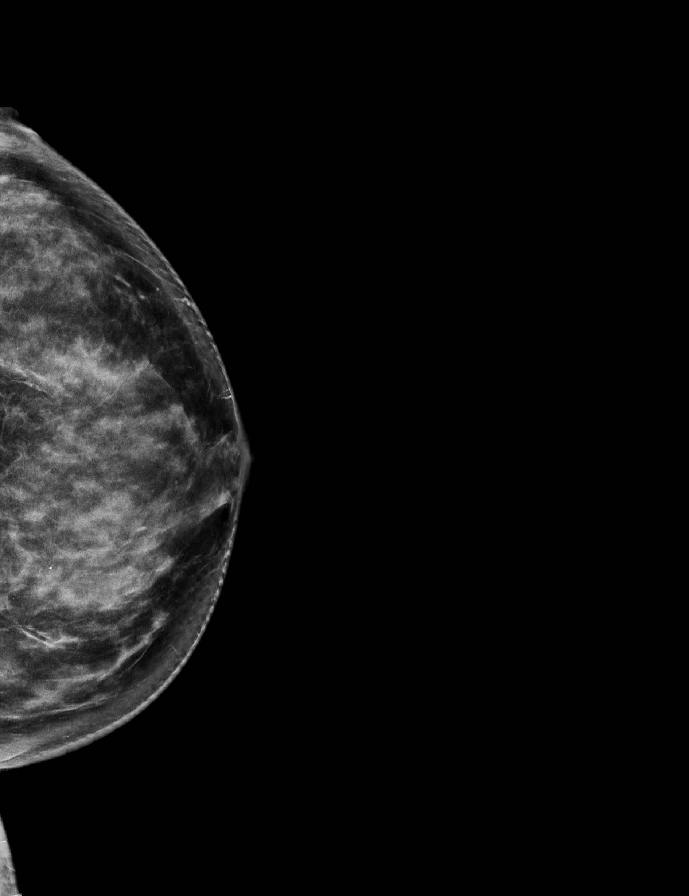

[L CC tomo · 2 of 71 frames shown]
[frame 23/71]
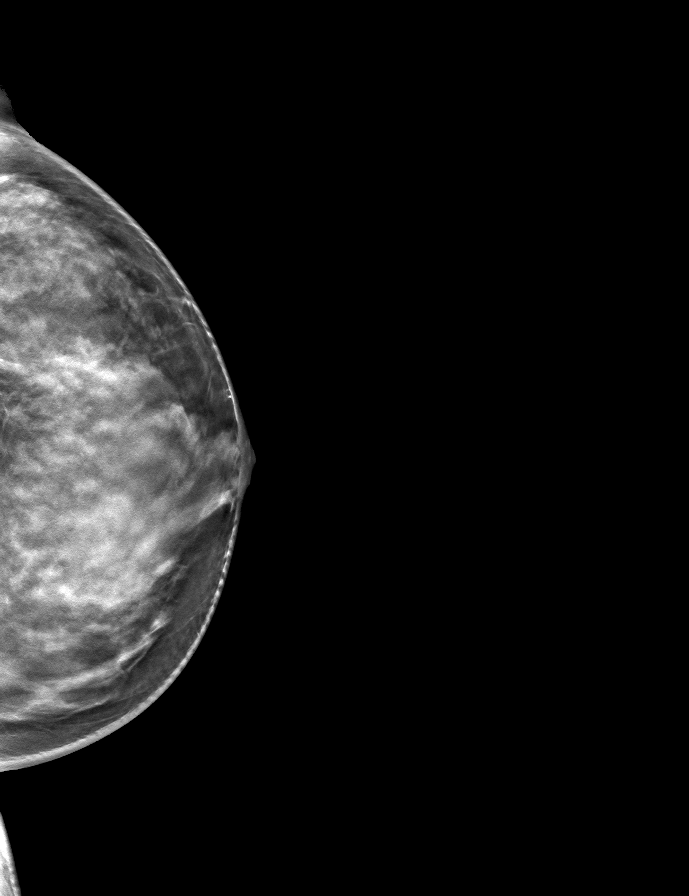
[frame 36/71]
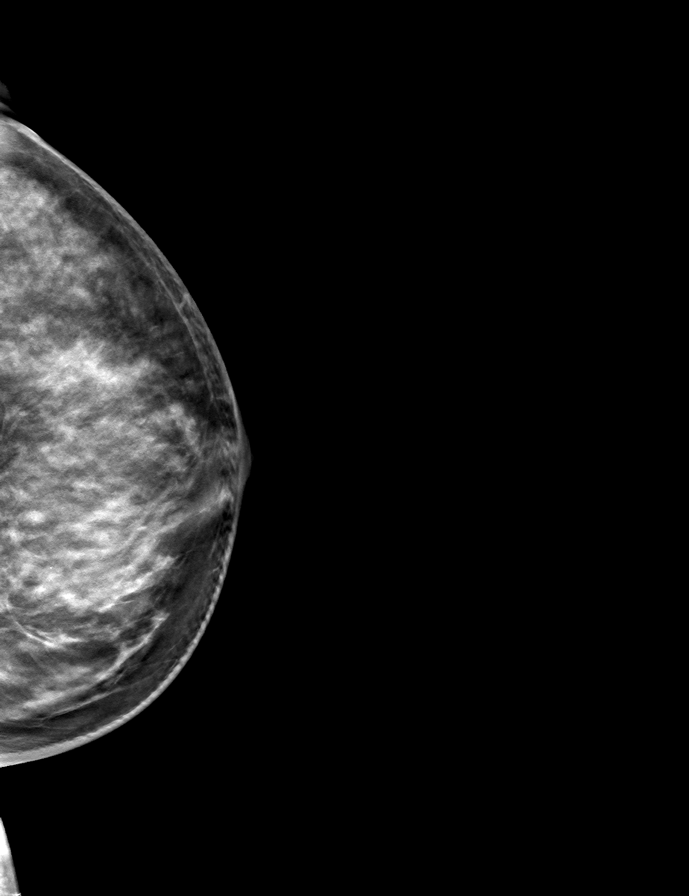

[R CC tomo · tomo slice 33/64.0]
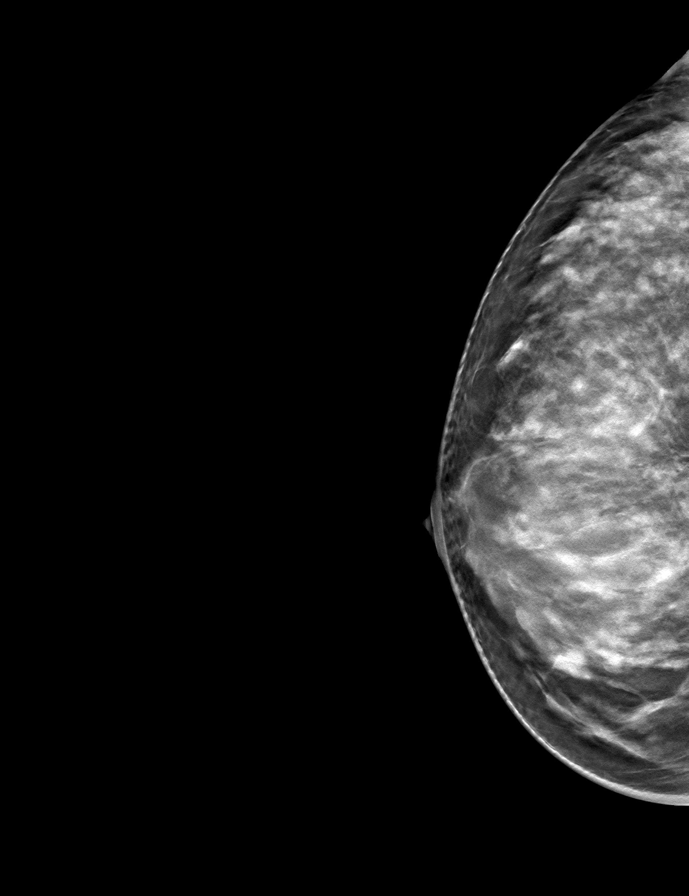

[R MLO tomo · tomo slice 31/60.0]
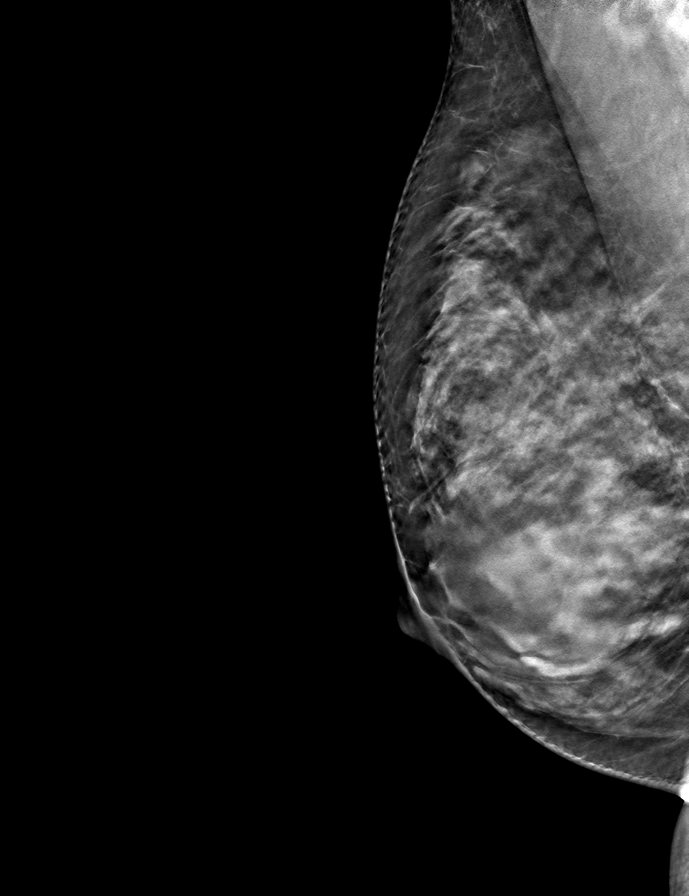

[L MLO tomo · tomo slice 31/60.0]
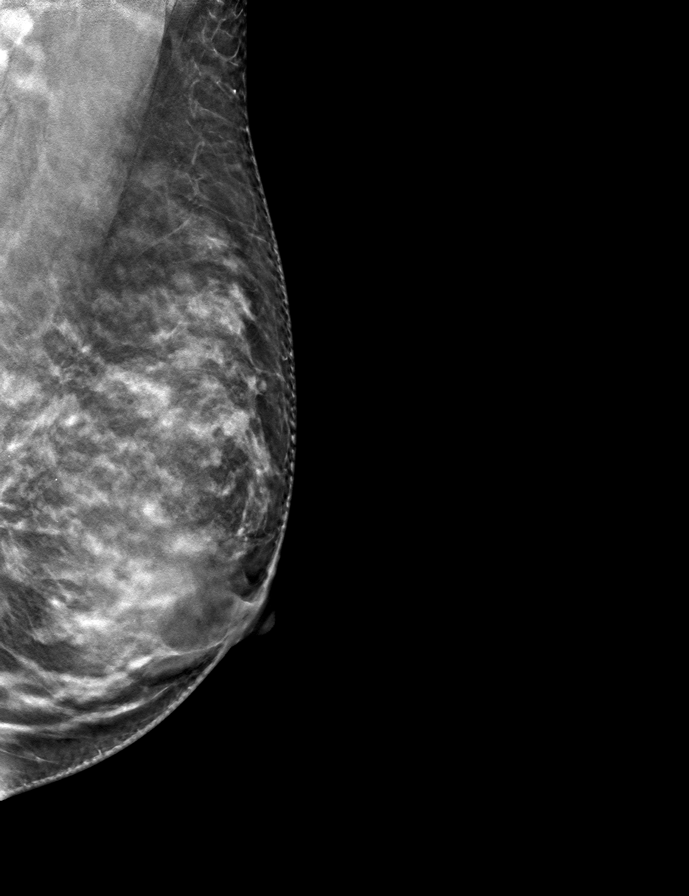

[9 of 24 positions shown; findings below may reference images not displayed]

ACR Breast Density Category d: The breast tissue is extremely dense,
which lowers the sensitivity of mammography.
FINDINGS: In the left breast, calcifications warrant further evaluation. In
the right breast, no findings suspicious for malignancy. Images were
processed with CAD.
IMPRESSION: Further evaluation is suggested for calcifications in the left
breast.

RECOMMENDATION:
Diagnostic mammogram of the left breast. (Code:EA-[id number])

The patient will be contacted regarding the findings, and additional
imaging will be scheduled.

BI-RADS CATEGORY  0: Incomplete. Need additional imaging evaluation
and/or prior mammograms for comparison.

## 2019-06-19 ENCOUNTER — Other Ambulatory Visit: Payer: Self-pay | Admitting: Obstetrics and Gynecology

## 2019-06-19 DIAGNOSIS — R7989 Other specified abnormal findings of blood chemistry: Secondary | ICD-10-CM

## 2019-08-21 ENCOUNTER — Other Ambulatory Visit (INDEPENDENT_AMBULATORY_CARE_PROVIDER_SITE_OTHER): Payer: BC Managed Care – PPO

## 2019-08-21 ENCOUNTER — Other Ambulatory Visit: Payer: Self-pay

## 2019-08-21 DIAGNOSIS — R7989 Other specified abnormal findings of blood chemistry: Secondary | ICD-10-CM

## 2019-08-22 LAB — VITAMIN D 25 HYDROXY (VIT D DEFICIENCY, FRACTURES): Vit D, 25-Hydroxy: 70.9 ng/mL (ref 30.0–100.0)

## 2020-04-23 DIAGNOSIS — Z03818 Encounter for observation for suspected exposure to other biological agents ruled out: Secondary | ICD-10-CM | POA: Diagnosis not present

## 2020-07-05 ENCOUNTER — Other Ambulatory Visit: Payer: Self-pay | Admitting: Obstetrics and Gynecology

## 2020-07-05 DIAGNOSIS — Z09 Encounter for follow-up examination after completed treatment for conditions other than malignant neoplasm: Secondary | ICD-10-CM

## 2020-07-30 ENCOUNTER — Other Ambulatory Visit: Payer: Self-pay | Admitting: Obstetrics and Gynecology

## 2020-07-30 DIAGNOSIS — R921 Mammographic calcification found on diagnostic imaging of breast: Secondary | ICD-10-CM

## 2020-08-02 ENCOUNTER — Ambulatory Visit
Admission: RE | Admit: 2020-08-02 | Discharge: 2020-08-02 | Disposition: A | Discharge status: Home / Self Care | Payer: BC Managed Care – PPO | Source: Ambulatory Visit | Attending: Obstetrics and Gynecology | Admitting: Obstetrics and Gynecology

## 2020-08-02 ENCOUNTER — Other Ambulatory Visit: Payer: Self-pay

## 2020-08-02 DIAGNOSIS — R921 Mammographic calcification found on diagnostic imaging of breast: Secondary | ICD-10-CM | POA: Diagnosis not present

## 2020-08-05 NOTE — Progress Notes (Signed)
45 y.o. G0P0000 Married Asian/Brazilian female here for annual exam.    Had Covid 04/2020.   Has fatigue.  Wanted a pap but she has started her period.  Seeing accupunturist for low back pain. Also having right upper quadrant pain under her ribs.  No relationship to eating fatty or heavy foods. No blood in urine. No history of renal stones.  No constipation.  [place] while running last year.   Saw an orthopedist in the past and was told she has right hip issues due to weakness.   Menstruation is more strong and feeling more swollen.  Has increased heat at night.  Not seeking treatment.  Her diet is mostly vegetarian.   Just started a new job.   PCP:  None   Patient's last menstrual period was 08/05/2020 (exact date).     Period Cycle (Days): 30 Period Duration (Days): 7 Period Pattern: Regular Menstrual Flow:  (heavy first 2 days then tapers) Menstrual Control: Tampon,Maxi pad Menstrual Control Change Freq (Hours): changes super tampon every 6 hours on heaviest day Dysmenorrhea:  (has bloating and low back pain)     Sexually active: yes The current method of family planning is none  Exercising: yes.  HITT Smoker: NO   Health Maintenance: Pap:  Pap: 04-22-18 Neg:Neg HR FOY,7741 normal per patient in [place] History of abnormal Pap:  NO MMG: 08/02/20 density C Bi-rads 2 benign  Colonoscopy: n/a BMD:   n/a  Result  n/a TDaP: unsure.  Declines today. Gardasil:  no HIV:Neg in past Hep C:Neg in past Screening Labs:  Today.   reports that she has never smoked. She has never used smokeless tobacco. She reports previous alcohol use. She reports that she does not use drugs.  History reviewed. No pertinent past medical history.  Past Surgical History:  Procedure Laterality Date  . NO PAST SURGERIES      Current Outpatient Medications  Medication Sig Dispense Refill  . Cholecalciferol (VITAMIN D) 50 MCG (2000 UT) CAPS Take 1 capsule by mouth at bedtime.     No current  facility-administered medications for this visit.    Family History  Problem Relation Age of Onset  . Cancer Maternal Uncle        colon  . Cancer Maternal Grandfather        kidney  . Diabetes Mother   . Osteoporosis Mother   . Heart attack Father   . Hyperlipidemia Father   . Diabetes Father   . Breast cancer Paternal Aunt   . Aneurysm Paternal Uncle   . Brain cancer Maternal Grandmother   . Anuerysm Paternal Grandmother     Review of Systems  Constitutional: Positive for fatigue.  All other systems reviewed and are negative.   Exam:   BP 104/70   Pulse 77   Ht 5\' 1"  (1.549 m)   Wt 132 lb 12.8 oz (60.2 kg)   LMP 08/05/2020 (Exact Date)   SpO2 100%   BMI 25.09 kg/m     General appearance: alert, cooperative and appears stated age Head: normocephalic, without obvious abnormality, atraumatic Neck: no adenopathy, supple, symmetrical, trachea midline and thyroid normal to inspection and palpation Lungs: clear to auscultation bilaterally Tenderness along right costal margin.  Breasts: normal appearance, no masses or tenderness, No nipple retraction or dimpling, No nipple discharge or bleeding, No axillary adenopathy Heart: regular rate and rhythm Abdomen: soft, non-tender; no masses, no organomegaly Extremities: extremities normal, atraumatic, no cyanosis or edema Skin: skin color, texture, turgor normal.  No rashes or lesions Lymph nodes: cervical, supraclavicular, and axillary nodes normal. Neurologic: grossly normal  Pelvic: External genitalia:  no lesions              No abnormal inguinal nodes palpated.              Urethra:  normal appearing urethra with no masses, tenderness or lesions              Bartholins and Skenes: normal                 Vagina: normal appearing vagina with normal color and discharge, no lesions              Cervix: no lesions.  Menstrual flow.              Pap taken: No. Bimanual Exam:  Uterus:  normal size, contour, position,  consistency, mobility, non-tender              Adnexa: no mass, fullness, tenderness              Rectal exam: Yes.  .  Confirms.              Anus:  normal sphincter tone, no lesions  Chaperone was present for exam.  Assessment:   Well woman visit with normal exam. Hx infertility.  Hx low vit D, resolved with high dose vit D. FH cancer. Chest wall pain.  Fatigue. Recent Covid.   Plan: Mammogram screening discussed. Self breast awareness reviewed. Pap and HR HPV 2025 by ASCCP guidelines.  Patient may want to do sooner. Guidelines for Calcium, Vitamin D, regular exercise program including cardiovascular and weight bearing exercise. Routine labs today.  She will establish care with PCP.  Follow up annually and prn.

## 2020-08-06 ENCOUNTER — Encounter: Payer: Self-pay | Admitting: Obstetrics and Gynecology

## 2020-08-06 ENCOUNTER — Ambulatory Visit (INDEPENDENT_AMBULATORY_CARE_PROVIDER_SITE_OTHER): Payer: BC Managed Care – PPO | Admitting: Obstetrics and Gynecology

## 2020-08-06 ENCOUNTER — Other Ambulatory Visit: Payer: Self-pay

## 2020-08-06 VITALS — BP 104/70 | HR 77 | Ht 61.0 in | Wt 132.8 lb

## 2020-08-06 DIAGNOSIS — Z01419 Encounter for gynecological examination (general) (routine) without abnormal findings: Secondary | ICD-10-CM | POA: Diagnosis not present

## 2020-08-06 LAB — VITAMIN B12: Vitamin B-12: 255 pg/mL (ref 200–1100)

## 2020-08-06 LAB — CBC: Hemoglobin: 13.5 g/dL (ref 11.7–15.5)

## 2020-08-06 NOTE — Patient Instructions (Signed)

## 2020-08-07 LAB — COMPREHENSIVE METABOLIC PANEL
AG Ratio: 1.8 (calc) (ref 1.0–2.5)
ALT: 6 U/L (ref 6–29)
AST: 16 U/L (ref 10–35)
Albumin: 4.7 g/dL (ref 3.6–5.1)
Alkaline phosphatase (APISO): 66 U/L (ref 31–125)
BUN: 8 mg/dL (ref 7–25)
CO2: 30 mmol/L (ref 20–32)
Calcium: 9.9 mg/dL (ref 8.6–10.2)
Chloride: 102 mmol/L (ref 98–110)
Creat: 0.72 mg/dL (ref 0.50–1.10)
Globulin: 2.6 g/dL (calc) (ref 1.9–3.7)
Glucose, Bld: 68 mg/dL (ref 65–99)
Potassium: 3.8 mmol/L (ref 3.5–5.3)
Sodium: 139 mmol/L (ref 135–146)
Total Bilirubin: 0.6 mg/dL (ref 0.2–1.2)
Total Protein: 7.3 g/dL (ref 6.1–8.1)

## 2020-08-07 LAB — LIPID PANEL
Cholesterol: 226 mg/dL — ABNORMAL HIGH (ref <200)
HDL: 84 mg/dL (ref >=50)
LDL Cholesterol (Calc): 118 mg/dL (calc) — ABNORMAL HIGH
Non-HDL Cholesterol (Calc): 142 mg/dL (calc) — ABNORMAL HIGH (ref <130)
Total CHOL/HDL Ratio: 2.7 (calc) (ref <5.0)
Triglycerides: 128 mg/dL (ref <150)

## 2020-08-07 LAB — CBC
HCT: 39.9 % (ref 35.0–45.0)
MCH: 31.2 pg (ref 27.0–33.0)
MCHC: 33.8 g/dL (ref 32.0–36.0)
MCV: 92.1 fL (ref 80.0–100.0)
MPV: 10.2 fL (ref 7.5–12.5)
Platelets: 328 10*3/uL (ref 140–400)
RBC: 4.33 10*6/uL (ref 3.80–5.10)
RDW: 12.7 % (ref 11.0–15.0)
WBC: 6.9 10*3/uL (ref 3.8–10.8)

## 2020-08-07 LAB — TSH: TSH: 3.36 mIU/L

## 2020-08-07 LAB — VITAMIN D 25 HYDROXY (VIT D DEFICIENCY, FRACTURES): Vit D, 25-Hydroxy: 41 ng/mL (ref 30–100)

## 2021-05-29 ENCOUNTER — Other Ambulatory Visit: Payer: Self-pay

## 2021-05-29 ENCOUNTER — Ambulatory Visit (INDEPENDENT_AMBULATORY_CARE_PROVIDER_SITE_OTHER): Payer: 59 | Admitting: Nurse Practitioner

## 2021-05-29 ENCOUNTER — Encounter: Payer: Self-pay | Admitting: Nurse Practitioner

## 2021-05-29 VITALS — BP 106/86 | HR 76 | Temp 97.2°F | Ht 61.25 in | Wt 134.2 lb

## 2021-05-29 DIAGNOSIS — Z803 Family history of malignant neoplasm of breast: Secondary | ICD-10-CM | POA: Insufficient documentation

## 2021-05-29 DIAGNOSIS — Z13220 Encounter for screening for lipoid disorders: Secondary | ICD-10-CM

## 2021-05-29 DIAGNOSIS — Z136 Encounter for screening for cardiovascular disorders: Secondary | ICD-10-CM

## 2021-05-29 DIAGNOSIS — Z1211 Encounter for screening for malignant neoplasm of colon: Secondary | ICD-10-CM

## 2021-05-29 DIAGNOSIS — Z0000 Encounter for general adult medical examination without abnormal findings: Secondary | ICD-10-CM | POA: Diagnosis not present

## 2021-05-29 DIAGNOSIS — Z800 Family history of malignant neoplasm of digestive organs: Secondary | ICD-10-CM

## 2021-05-29 LAB — LIPID PANEL
Cholesterol: 196 mg/dL (ref 0–200)
HDL: 76 mg/dL (ref >39.00)
LDL Cholesterol: 105 mg/dL — ABNORMAL HIGH (ref 0–99)
NonHDL: 119.99
Total CHOL/HDL Ratio: 3
Triglycerides: 74 mg/dL (ref 0.0–149.0)
VLDL: 14.8 mg/dL (ref 0.0–40.0)

## 2021-05-29 LAB — COMPREHENSIVE METABOLIC PANEL
ALT: 9 U/L (ref 0–35)
AST: 14 U/L (ref 0–37)
Albumin: 4.4 g/dL (ref 3.5–5.2)
Alkaline Phosphatase: 66 U/L (ref 39–117)
BUN: 9 mg/dL (ref 6–23)
CO2: 28 mEq/L (ref 19–32)
Calcium: 9.6 mg/dL (ref 8.4–10.5)
Chloride: 103 mEq/L (ref 96–112)
Creatinine, Ser: 0.77 mg/dL (ref 0.40–1.20)
GFR: 92.59 mL/min (ref >60.00)
Glucose, Bld: 98 mg/dL (ref 70–99)
Potassium: 4.1 mEq/L (ref 3.5–5.1)
Sodium: 138 mEq/L (ref 135–145)
Total Bilirubin: 0.8 mg/dL (ref 0.2–1.2)
Total Protein: 7.1 g/dL (ref 6.0–8.3)

## 2021-05-29 LAB — CBC
HCT: 39.5 % (ref 36.0–46.0)
Hemoglobin: 13.1 g/dL (ref 12.0–15.0)
MCHC: 33.2 g/dL (ref 30.0–36.0)
MCV: 90 fl (ref 78.0–100.0)
Platelets: 345 10*3/uL (ref 150.0–400.0)
RBC: 4.39 Mil/uL (ref 3.87–5.11)
RDW: 13.1 % (ref 11.5–15.5)
WBC: 5.2 10*3/uL (ref 4.0–10.5)

## 2021-05-29 NOTE — Patient Instructions (Signed)
Go to lab for blood draw °You will be contacted to schedule appt with GI ° °Thank you for choosing [facility] ° °Preventive Care 40-47 Years Old, Female °Preventive care refers to lifestyle choices and visits with your health care provider that can promote health and wellness. Preventive care visits are also called wellness exams. °What can I expect for my preventive care visit? °Counseling °Your health care provider may ask you questions about your: °Medical history, including: °Past medical problems. °Family medical history. °Pregnancy history. °Current health, including: °Menstrual cycle. °Method of birth control. °Emotional well-being. °Home life and relationship well-being. °Sexual activity and sexual health. °Lifestyle, including: °Alcohol, nicotine or tobacco, and drug use. °Access to firearms. °Diet, exercise, and sleep habits. °Work and work environment. °Sunscreen use. °Safety issues such as seatbelt and bike helmet use. °Physical exam °Your health care provider will check your: °Height and weight. These may be used to calculate your BMI (body mass index). BMI is a measurement that tells if you are at a healthy weight. °Waist circumference. This measures the distance around your waistline. This measurement also tells if you are at a healthy weight and may help predict your risk of certain diseases, such as type 2 diabetes and high blood pressure. °Heart rate and blood pressure. °Body temperature. °Skin for abnormal spots. °What immunizations do I need? °Vaccines are usually given at various ages, according to a schedule. Your health care provider will recommend vaccines for you based on your age, medical history, and lifestyle or other factors, such as travel or where you work. °What tests do I need? °Screening °Your health care provider may recommend screening tests for certain conditions. This may include: °Lipid and cholesterol levels. °Diabetes screening. This is done by checking your blood  sugar (glucose) after you have not eaten for a while (fasting). °Pelvic exam and Pap test. °Hepatitis B test. °Hepatitis C test. °HIV (human immunodeficiency virus) test. °STI (sexually transmitted infection) testing, if you are at risk. °Lung cancer screening. °Colorectal cancer screening. °Mammogram. Talk with your health care provider about when you should start having regular mammograms. This may depend on whether you have a family history of breast cancer. °BRCA-related cancer screening. This may be done if you have a family history of breast, ovarian, tubal, or peritoneal cancers. °Bone density scan. This is done to screen for osteoporosis. °Talk with your health care provider about your test results, treatment options, and if necessary, the need for more tests. °Follow these instructions at home: °Eating and drinking ° °Eat a diet that includes fresh fruits and vegetables, whole grains, lean protein, and low-fat dairy products. °Take vitamin and mineral supplements as recommended by your health care provider. °Do not drink alcohol if: °Your health care provider tells you not to drink. °You are pregnant, may be pregnant, or are planning to become pregnant. °If you drink alcohol: °Limit how much you have to 0-1 drink a day. °Know how much alcohol is in your drink. In the [place] one drink equals one 12 oz bottle of beer (355 mL), one 5 oz glass of wine (148 mL), or one 1½ oz glass of hard liquor (44 mL). °Lifestyle °Brush your teeth every morning and night with fluoride toothpaste. Floss one time each day. °Exercise for at least 30 minutes 5 or more days each week. °Do not use any products that contain nicotine or tobacco. These products include cigarettes, chewing tobacco, and vaping devices, such as e-cigarettes. If you need help quitting, ask your health   care provider. Do not use drugs. If you are sexually active, practice safe sex. Use a condom or other form of protection to prevent STIs. If you do not  wish to become pregnant, use a form of birth control. If you plan to become pregnant, see your health care provider for a prepregnancy visit. Take aspirin only as told by your health care provider. Make sure that you understand how much to take and what form to take. Work with your health care provider to find out whether it is safe and beneficial for you to take aspirin daily. Find healthy ways to manage stress, such as: Meditation, yoga, or listening to music. Journaling. Talking to a trusted person. Spending time with friends and family. Minimize exposure to UV radiation to reduce your risk of skin cancer. Safety Always wear your seat belt while driving or riding in a vehicle. Do not drive: If you have been drinking alcohol. Do not ride with someone who has been drinking. When you are tired or distracted. While texting. If you have been using any mind-altering substances or drugs. Wear a helmet and other protective equipment during sports activities. If you have firearms in your house, make sure you follow all gun safety procedures. Seek help if you have been physically or sexually abused. What's next? Visit your health care provider once a year for an annual wellness visit. Ask your health care provider how often you should have your eyes and teeth checked. Stay up to date on all vaccines. This information is not intended to replace advice given to you by your health care provider. Make sure you discuss any questions you have with your health care provider. Document Revised: 10/02/2020 Document Reviewed: 10/02/2020 Elsevier Patient Education  [place].

## 2021-05-29 NOTE — Progress Notes (Signed)
Subjective:    Patient ID: Heidi Daniels, female    DOB: 20-Mar-1976, 46 y.o.   MRN: [id number]  Patient presents today for CPE   HPI  Vision:up to date Dental:up to date Diet:regular, minimal Exercise:4x/week, cardio and weight training Weight:  Wt Readings from Last 3 Encounters:  05/29/21 134 lb 3.2 oz (60.9 kg)  08/06/20 132 lb 12.8 oz (60.2 kg)  05/24/19 128 lb 9.6 oz (58.3 kg)    Sexual History (orientation,birth control, marital status, STD):married, no need for STD screen, pelvic and breast exam completed by GYN: Dr. Quincy Simmonds. Up to date with PAP and mammogram. Needs colonoscopy, agreed to GI referral  Depression/Suicide: Depression screen [facility] 2/9 05/29/2021  Decreased Interest 0  Down, Depressed, Hopeless 0  PHQ - 2 Score 0  Altered sleeping 0  Tired, decreased energy 1  Change in appetite 1  Feeling bad or failure about yourself  0  Trouble concentrating 1  Moving slowly or fidgety/restless 0  Suicidal thoughts 0  PHQ-9 Score 3  Difficult doing work/chores Not difficult at all   Immunizations: (TDAP, Hep C screen, Pneumovax, Influenza, zoster)  Health Maintenance  Topic Date Due   HIV Screening  Never done   Hepatitis C Screening: USPSTF Recommendation to screen - Ages 54-79 yo.  Never done   Colon Cancer Screening  Never done   Pap Smear  04/22/2021   Flu Shot  07/18/2021*   Tetanus Vaccine  05/29/2022*   HPV Vaccine  Aged Out  *Topic was postponed. The date shown is not the original due date.   Fall Risk: Fall Risk  05/29/2021  Falls in the past year? 1  Number falls in past yr: 0  Injury with Fall? 1  Risk for fall due to : History of fall(s)  Follow up Falls evaluation completed   Medications and allergies reviewed with patient and updated if appropriate.  Patient Active Problem List   Diagnosis Date Noted   Family history of breast cancer 05/29/2021   Family history of colon cancer 05/29/2021    No current outpatient medications on file  prior to visit.   No current facility-administered medications on file prior to visit.   History reviewed. No pertinent past medical history.  Past Surgical History:  Procedure Laterality Date   NO PAST SURGERIES      Social History   Socioeconomic History   Marital status: Married    Spouse name: Not on file   Number of children: Not on file   Years of education: Not on file   Highest education level: Not on file  Occupational History   Not on file  Tobacco Use   Smoking status: Never   Smokeless tobacco: Never  Vaping Use   Vaping Use: Never used  Substance and Sexual Activity   Alcohol use: Not Currently   Drug use: Never   Sexual activity: Yes    Birth control/protection: None  Other Topics Concern   Not on file  Social History Narrative   Not on file   Social Determinants of Health   Financial Resource Strain: Not on file  Food Insecurity: Not on file  Transportation Needs: Not on file  Physical Activity: Not on file  Stress: Not on file  Social Connections: Not on file   Family History  Problem Relation Age of Onset   Diabetes Mother    Osteoporosis Mother    Heart attack Father 80   Hyperlipidemia Father    Diabetes Father  Heart disease Father    Diabetes Brother    Hyperlipidemia Brother    Cancer Maternal Uncle 80       colon   Breast cancer Paternal Aunt    Cancer Paternal Aunt 3       breast cancer   Stroke Paternal Aunt 33   Cancer Paternal Uncle 38       prostate cancer   Hypertension Paternal Uncle    Stroke Paternal Uncle    Brain cancer Maternal Grandmother    Cancer Maternal Grandmother 64       brain cancer   Cancer Maternal Grandfather 20       lung cancer secondary to tobacco use   Anuerysm Paternal Grandmother    Stroke Paternal Grandmother    Heart disease Paternal Grandfather    Cancer Cousin 30       stomach cancer       Review of Systems  Constitutional:  Negative for fever, malaise/fatigue and weight loss.   HENT:  Negative for congestion and sore throat.   Eyes:        Negative for visual changes  Respiratory:  Negative for cough and shortness of breath.   Cardiovascular:  Negative for chest pain, palpitations and leg swelling.  Gastrointestinal:  Negative for blood in stool, constipation, diarrhea and heartburn.  Genitourinary:  Negative for dysuria, frequency and urgency.  Musculoskeletal:  Negative for falls, joint pain and myalgias.  Skin:  Negative for rash.  Neurological:  Negative for dizziness, sensory change and headaches.  Endo/Heme/Allergies:  Does not bruise/bleed easily.  Psychiatric/Behavioral:  Negative for depression, substance abuse and suicidal ideas. The patient is not nervous/anxious and does not have insomnia.    Objective:   Vitals:   05/29/21 0928  BP: 106/86  Pulse: 76  Temp: (!) 97.2 F (36.2 C)  SpO2: 99%   Body mass index is 25.15 kg/m.  Physical Examination:  Physical Exam Vitals reviewed.  Constitutional:      General: She is not in acute distress.    Appearance: She is well-developed.  HENT:     Right Ear: Tympanic membrane, ear canal and external ear normal.     Left Ear: Tympanic membrane, ear canal and external ear normal.  Eyes:     Extraocular Movements: Extraocular movements intact.     Conjunctiva/sclera: Conjunctivae normal.     Pupils: Pupils are equal, round, and reactive to light.  Cardiovascular:     Rate and Rhythm: Normal rate and regular rhythm.     Pulses: Normal pulses.     Heart sounds: Normal heart sounds.  Pulmonary:     Effort: Pulmonary effort is normal. No respiratory distress.     Breath sounds: Normal breath sounds.  Chest:     Chest wall: No tenderness.  Abdominal:     General: Bowel sounds are normal.     Palpations: Abdomen is soft.  Musculoskeletal:        General: Normal range of motion.     Cervical back: Normal range of motion and neck supple.     Right lower leg: No edema.     Left lower leg: No  edema.  Skin:    General: Skin is warm and dry.  Neurological:     Mental Status: She is alert and oriented to person, place, and time.     Deep Tendon Reflexes: Reflexes are normal and symmetric.  Psychiatric:        Mood and Affect: Mood normal.  Behavior: Behavior normal.        Thought Content: Thought content normal.    ASSESSMENT and PLAN: This visit occurred during the SARS-CoV-2 public health emergency.  Safety protocols were in place, including screening questions prior to the visit, additional usage of staff PPE, and extensive cleaning of exam room while observing appropriate contact time as indicated for disinfecting solutions.   Heidi Daniels was seen today for establish care.  Diagnoses and all orders for this visit:  Preventative health care -     Comprehensive metabolic panel -     CBC  Colon cancer screening -     Ambulatory referral to Gastroenterology  Encounter for lipid screening for cardiovascular disease -     Lipid panel  Family history of colon cancer -     Ambulatory referral to Gastroenterology      Problem List Items Addressed This Visit       Other   Family history of colon cancer   Relevant Orders   Ambulatory referral to Gastroenterology   Other Visit Diagnoses     Preventative health care    -  Primary   Relevant Orders   Comprehensive metabolic panel (Completed)   CBC (Completed)   Colon cancer screening       Relevant Orders   Ambulatory referral to Gastroenterology   Encounter for lipid screening for cardiovascular disease       Relevant Orders   Lipid panel (Completed)       Follow up: Return in about 1 year (around 05/29/2022) for CPE (fasting).  Wilfred Lacy, NP

## 2021-06-04 ENCOUNTER — Encounter: Payer: Self-pay | Admitting: Gastroenterology

## 2021-06-24 ENCOUNTER — Other Ambulatory Visit: Payer: Self-pay | Admitting: Obstetrics and Gynecology

## 2021-06-24 ENCOUNTER — Other Ambulatory Visit: Payer: Self-pay

## 2021-06-24 ENCOUNTER — Ambulatory Visit (AMBULATORY_SURGERY_CENTER): Payer: Self-pay

## 2021-06-24 VITALS — Ht 60.0 in | Wt 132.0 lb

## 2021-06-24 DIAGNOSIS — Z1231 Encounter for screening mammogram for malignant neoplasm of breast: Secondary | ICD-10-CM

## 2021-06-24 DIAGNOSIS — Z1211 Encounter for screening for malignant neoplasm of colon: Secondary | ICD-10-CM

## 2021-06-24 MED ORDER — NA SULFATE-K SULFATE-MG SULF 17.5-3.13-1.6 GM/177ML PO SOLN: 1.0000 | Freq: Once | ORAL | 0 refills | Status: AC

## 2021-06-24 NOTE — Progress Notes (Signed)
Denies allergies to eggs or soy products. Denies complication of anesthesia or sedation. Denies use of weight loss medication. Denies use of O2.   Emmi instructions given for colonoscopy.  

## 2021-06-26 ENCOUNTER — Encounter: Payer: Self-pay | Admitting: Gastroenterology

## 2021-07-08 ENCOUNTER — Other Ambulatory Visit: Payer: Self-pay

## 2021-07-08 ENCOUNTER — Ambulatory Visit (AMBULATORY_SURGERY_CENTER): Payer: 59 | Admitting: Gastroenterology

## 2021-07-08 ENCOUNTER — Encounter: Payer: Self-pay | Admitting: Gastroenterology

## 2021-07-08 VITALS — BP 100/61 | HR 69 | Temp 98.6°F | Resp 10 | Ht 61.0 in | Wt 134.0 lb

## 2021-07-08 DIAGNOSIS — Z1211 Encounter for screening for malignant neoplasm of colon: Secondary | ICD-10-CM

## 2021-07-08 MED ORDER — SODIUM CHLORIDE 0.9 % IV SOLN: 500.0000 mL | Freq: Once | INTRAVENOUS | Status: DC

## 2021-07-08 NOTE — Op Note (Signed)
[facility] ?Patient Name: Heidi Daniels ?Procedure Date: 07/08/2021 2:04 PM ?MRN: [id number] ?Endoscopist: Thornton Park MD, MD ?Age: 46 ?Referring MD:  ?Date of Birth: 24-Mar-1976 ?Gender: Female ?Account #: [id number] ?Procedure:                Colonoscopy ?Indications:              Screening for colorectal malignant neoplasm, This  ?                          is the patient's first colonoscopy ?                          Maternal uncle with colon cancer at a young age ?                          No other known family history of colon cancer or  ?                          polyps ?Medicines:                Monitored Anesthesia Care ?Procedure:                Pre-Anesthesia Assessment: ?                          - Prior to the procedure, a History and Physical  ?                          was performed, and patient medications and  ?                          allergies were reviewed. The patient's tolerance of  ?                          previous anesthesia was also reviewed. The risks  ?                          and benefits of the procedure and the sedation  ?                          options and risks were discussed with the patient.  ?                          All questions were answered, and informed consent  ?                          was obtained. Prior Anticoagulants: The patient has  ?                          taken no previous anticoagulant or antiplatelet  ?                          agents. ASA Grade Assessment: II - A patient with  ?  mild systemic disease. After reviewing the risks  ?                          and benefits, the patient was deemed in  ?                          satisfactory condition to undergo the procedure. ?                          After obtaining informed consent, the colonoscope  ?                          was passed under direct vision. Throughout the  ?                          procedure, the patient's blood pressure, pulse, and  ?                           oxygen saturations were monitored continuously. The  ?                          Olympus [id number]-[id number] (#[id number]) Colonoscope was  ?                          introduced through the anus and advanced to the 3  ?                          cm into the ileum. A second forward view of the  ?                          right colon was performed. The colonoscopy was  ?                          performed without difficulty. The patient tolerated  ?                          the procedure well. The quality of the bowel  ?                          preparation was excellent. The terminal ileum,  ?                          ileocecal valve, appendiceal orifice, and rectum  ?                          were photographed. ?Scope In: 2:13:28 PM ?Scope Out: 2:26:48 PM ?Scope Withdrawal Time: 0 hours 9 minutes 27 seconds  ?Total Procedure Duration: 0 hours 13 minutes 20 seconds  ?Findings:                 The perianal and digital rectal examinations were  ?                          normal. ?  A few small-mouthed diverticula were found in the  ?                          sigmoid colon. ?                          The exam was otherwise without abnormality on  ?                          direct and retroflexion views. ?Complications:            No immediate complications. ?Estimated Blood Loss:     Estimated blood loss: none. ?Impression:               - Diverticulosis in the sigmoid colon. ?                          - The examination was otherwise normal on direct  ?                          and retroflexion views. ?                          - No specimens collected. ?Recommendation:           - Patient has a contact number available for  ?                          emergencies. The signs and symptoms of potential  ?                          delayed complications were discussed with the  ?                          patient. Return to normal activities tomorrow.  ?                          Written discharge  instructions were provided to the  ?                          patient. ?                          - Resume previous diet. High fiber diet recommended. ?                          - Continue present medications. ?                          - Repeat colonoscopy in 10 years for surveillance,  ?                          earlier with new symptoms. ?                          - Emerging evidence supports eating a diet of  ?  fruits, vegetables, grains, calcium, and yogurt  ?                          while reducing red meat and alcohol may reduce the  ?                          risk of colon cancer. ?                          - Thank you for allowing me to be involved in your  ?                          colon cancer prevention. ?Thornton Park MD, MD ?07/08/2021 2:31:42 PM ?This report has been signed electronically. ?

## 2021-07-08 NOTE — Progress Notes (Signed)
VS completed by CW.   Pt's states no medical or surgical changes since previsit or office visit.  

## 2021-07-08 NOTE — Patient Instructions (Signed)
Impression/Recommendations: ? ?Diverticulosis and High fiber diet handouts given to patient. ? ?Resume previous diet. ?Continue present medications. ? ?Repeat colonoscopy in 10 years for surveillance. ? ?YOU HAD AN ENDOSCOPIC PROCEDURE TODAY AT [facility]:   Refer to the procedure report that was given to you for any specific questions about what was found during the examination.  If the procedure report does not answer your questions, please call your gastroenterologist to clarify.  If you requested that your care partner not be given the details of your procedure findings, then the procedure report has been included in a sealed envelope for you to review at your convenience later. ? ?YOU SHOULD EXPECT: Some feelings of bloating in the abdomen. Passage of more gas than usual.  Walking can help get rid of the air that was put into your GI tract during the procedure and reduce the bloating. If you had a lower endoscopy (such as a colonoscopy or flexible sigmoidoscopy) you may notice spotting of blood in your stool or on the toilet paper. If you underwent a bowel prep for your procedure, you may not have a normal bowel movement for a few days. ? ?Please Note:  You might notice some irritation and congestion in your nose or some drainage.  This is from the oxygen used during your procedure.  There is no need for concern and it should clear up in a day or so. ? ?SYMPTOMS TO REPORT IMMEDIATELY: ? ?Following lower endoscopy (colonoscopy or flexible sigmoidoscopy): ? Excessive amounts of blood in the stool ? Significant tenderness or worsening of abdominal pains ? Swelling of the abdomen that is new, acute ? Fever of 100?F or higher ?For urgent or emergent issues, a gastroenterologist can be reached at any hour by calling [phone]. ?Do not use MyChart messaging for urgent concerns.  ? ? ?DIET:  We do recommend a small meal at first, but then you may proceed to your regular diet.  Drink plenty of  fluids but you should avoid alcoholic beverages for 24 hours. ? ?ACTIVITY:  You should plan to take it easy for the rest of today and you should NOT DRIVE or use heavy machinery until tomorrow (because of the sedation medicines used during the test).   ? ?FOLLOW UP: ?Our staff will call the number listed on your records 48-72 hours following your procedure to check on you and address any questions or concerns that you may have regarding the information given to you following your procedure. If we do not reach you, we will leave a message.  We will attempt to reach you two times.  During this call, we will ask if you have developed any symptoms of COVID 19. If you develop any symptoms (ie: fever, flu-like symptoms, shortness of breath, cough etc.) before then, please call [phone].  If you test positive for Covid 19 in the 2 weeks post procedure, please call and report this information to [place].   ? ?If any biopsies were taken you will be contacted by phone or by letter within the next 1-3 weeks.  Please call us at [phone] if you have not heard about the biopsies in 3 weeks.  ? ? ?SIGNATURES/CONFIDENTIALITY: ?You and/or your care partner have signed paperwork which will be entered into your electronic medical record.  These signatures attest to the fact that that the information above on your After Visit Summary has been reviewed and is understood.  Full responsibility of the confidentiality of this discharge information  lies with you and/or your care-partner.  ?

## 2021-07-08 NOTE — Progress Notes (Signed)
? ?  Referring Provider: Flossie Buffy, NP ?Primary Care Physician:  Flossie Buffy, NP ? ?Reason for Procedure:  Colon cancer screening ? ? ?IMPRESSION:  ?Need for colon cancer screening ?Appropriate candidate for monitored anesthesia care ? ?PLAN: ?Colonoscopy in the [place] today ? ? ?HPI: Heidi Daniels is a 46 y.o. female presents for screening colonoscopy. ? ?No prior colonoscopy or colon cancer screening. ? ?No baseline GI symptoms.  ? ?Maternal uncle with colon cancer at age 22. No known family history of colon cancer or polyps. No family history of uterine/endometrial cancer, pancreatic cancer or gastric/stomach cancer. ? ? ?Past Medical History:  ?Diagnosis Date  ? Hyperlipidemia   ? ? ?Past Surgical History:  ?Procedure Laterality Date  ? NO PAST SURGERIES    ? ? ?Current Outpatient Medications  ?Medication Sig Dispense Refill  ? OVER THE COUNTER MEDICATION B 12 tablet, one tablet daily.    ? OVER THE COUNTER MEDICATION Co Q 10 one capsule daily.    ? OVER THE COUNTER MEDICATION Collagen 6 grams daily.    ? ?Current Facility-Administered Medications  ?Medication Dose Route Frequency Provider Last Rate Last Admin  ? 0.9 %  sodium chloride infusion  500 mL Intravenous Once Thornton Park, MD      ? ? ?Allergies as of 07/08/2021  ? (No Known Allergies)  ? ? ?Family History  ?Problem Relation Age of Onset  ? Diabetes Mother   ? Osteoporosis Mother   ? Heart attack Father 58  ? Hyperlipidemia Father   ? Diabetes Father   ? Heart disease Father   ? Diabetes Brother   ? Hyperlipidemia Brother   ? Colon cancer Maternal Uncle   ? Cancer Maternal Uncle 13  ?     colon  ? Breast cancer Paternal Aunt   ? Cancer Paternal Aunt 61  ?     breast cancer  ? Stroke Paternal Aunt 54  ? Cancer Paternal Uncle 56  ?     prostate cancer  ? Hypertension Paternal Uncle   ? Stroke Paternal Uncle   ? Brain cancer Maternal Grandmother   ? Cancer Maternal Grandmother 71  ?     brain cancer  ? Cancer Maternal Grandfather  4  ?     lung cancer secondary to tobacco use  ? Anuerysm Paternal Grandmother   ? Stroke Paternal Grandmother   ? Heart disease Paternal Grandfather   ? Stomach cancer Cousin   ? Cancer Cousin 50  ?     stomach cancer  ? Esophageal cancer Neg Hx   ? Rectal cancer Neg Hx   ? ? ? ?Physical Exam: ?General:   Alert,  well-nourished, pleasant and cooperative in NAD ?Head:  Normocephalic and atraumatic. ?Eyes:  Sclera clear, no icterus.   Conjunctiva pink. ?Mouth:  No deformity or lesions.   ?Neck:  Supple; no masses or thyromegaly. ?Lungs:  Clear throughout to auscultation.   No wheezes. ?Heart:  Regular rate and rhythm; no murmurs. ?Abdomen:  Soft, non-tender, nondistended, normal bowel sounds, no rebound or guarding.  ?Msk:  Symmetrical. No boney deformities ?LAD: No inguinal or umbilical LAD ?Extremities:  No clubbing or edema. ?Neurologic:  Alert and  oriented x4;  grossly nonfocal ?Skin:  No obvious rash or bruise. ?Psych:  Alert and cooperative. Normal mood and affect. ? ? ? ? ?Studies/Results: ?No results found. ? ? ? ?Denene Alamillo L. Tarri Glenn, MD, MPH ?07/08/2021, 2:01 PM ? ? ? ?  ?

## 2021-07-08 NOTE — Progress Notes (Signed)
PT taken to PACU. Monitors in place. VSS. Report given to RN. 

## 2021-07-10 ENCOUNTER — Telehealth: Payer: Self-pay | Admitting: *Deleted

## 2021-07-10 NOTE — Telephone Encounter (Signed)
First attempt, no answer and no VM.  ?

## 2021-07-10 NOTE — Telephone Encounter (Signed)
?  Follow up Call- ? ? ?  07/08/2021  ?  1:25 PM  ?Call back number  ?Post procedure Call Back phone  # [phone]  ?Permission to leave phone message Yes  ?  ? ?Patient questions: ? ?Do you have a fever, pain , or abdominal swelling? No. ?Pain Score  0 * ? ?Have you tolerated food without any problems? Yes.   ? ?Have you been able to return to your normal activities? Yes.   ? ?Do you have any questions about your discharge instructions: ?Diet   No. ?Medications  No. ?Follow up visit  No. ? ?Do you have questions or concerns about your Care? No. ? ?Actions: ?* If pain score is 4 or above: ?No action needed, pain <4. ? ? ?

## 2021-07-14 ENCOUNTER — Ambulatory Visit: Payer: 59 | Admitting: Nurse Practitioner

## 2021-08-04 ENCOUNTER — Ambulatory Visit
Admission: RE | Admit: 2021-08-04 | Discharge: 2021-08-04 | Disposition: A | Discharge status: Home / Self Care | Payer: 59 | Source: Ambulatory Visit | Attending: Obstetrics and Gynecology | Admitting: Obstetrics and Gynecology

## 2021-08-04 DIAGNOSIS — Z1231 Encounter for screening mammogram for malignant neoplasm of breast: Secondary | ICD-10-CM

## 2021-08-05 NOTE — Progress Notes (Signed)
46 y.o. G0P0000 Married Asian/Brazilian female here for annual exam.   ? ?Patient having RLQ discomfort with last 3 cycles every 20-21 days, occurring for about 3 days prior to her period starting.  ?The pain is not significant but is bothersome.  ? ?Has right lateral pain occurring for some months. ?It improves with exercise.  ?She is doing cupuncture.  ?Pain is worse at night when she lays down.  ? ?Denies dysuria, blood in the urine, or renal stones.  ?No constipation unless not eating vegetables.  ?No blood in the stool.  ? ?Has seen an orthopedist for right sided back pain  2 years ago.  ?She was told she has has some loose connections of the soft tissues.  ? ?Wants a pap and and high risk HPV.  ?She understands insurance may not pay.  ? ?Traveled to [place] last month. ? ?PCP:   Wilfred Lacy, NP ? ?Patient's last menstrual period was 07/17/2021 (exact date).     ?Period Cycle (Days): 26 ?Period Duration (Days): 4 ?Period Pattern: Regular ?Menstrual Flow:  (first day heavy then tapers) ?Menstrual Control: Tampon, Maxi pad ?Menstrual Control Change Freq (Hours): changes super maxi 3 - 4 times per day ?Dysmenorrhea: None ?    ?Sexually active: Yes.    ?The current method of family planning is none.   Declines contraception.  ?Exercising:    Yes, HIT, Orange theory ?Smoker:  noNo. ? ?Health Maintenance: ?Pap:   04-22-18 Neg:Neg HR UXN,2355 normal per patient in [place] ?History of abnormal Pap:  no ?MMG:  08-04-21 Neg/Birads1 ?Colonoscopy:  07-08-21 normal;10 years ?BMD:   n/a  Result  n/a ?TDaP:  DECLINED WITH PCP ?Gardasil:   no ?HIV: Neg in the past ?Hep C: Neg in the past ?Screening Labs:  PCP ? ? reports that she has never smoked. She has never used smokeless tobacco. She reports that she does not currently use alcohol. She reports that she does not use drugs. ? ?Past Medical History:  ?Diagnosis Date  ? Hyperlipidemia   ? ? ?Past Surgical History:  ?Procedure Laterality Date  ? NO PAST SURGERIES    ? ? ?Current  Outpatient Medications  ?Medication Sig Dispense Refill  ? OVER THE COUNTER MEDICATION B 12 tablet, one tablet daily.    ? OVER THE COUNTER MEDICATION Co Q 10 one capsule daily.    ? OVER THE COUNTER MEDICATION Collagen 6 grams daily.    ? ?No current facility-administered medications for this visit.  ? ? ?Family History  ?Problem Relation Age of Onset  ? Diabetes Mother   ? Osteoporosis Mother   ? Heart attack Father 32  ? Hyperlipidemia Father   ? Diabetes Father   ? Heart disease Father   ? Diabetes Brother   ? Hyperlipidemia Brother   ? Colon cancer Maternal Uncle   ? Cancer Maternal Uncle 75  ?     colon  ? Breast cancer Paternal Aunt   ? Cancer Paternal Aunt 70  ?     breast cancer  ? Stroke Paternal Aunt 39  ? Cancer Paternal Uncle 34  ?     prostate cancer  ? Hypertension Paternal Uncle   ? Stroke Paternal Uncle   ? Brain cancer Maternal Grandmother   ? Cancer Maternal Grandmother 61  ?     brain cancer  ? Cancer Maternal Grandfather 32  ?     lung cancer secondary to tobacco use  ? Anuerysm Paternal Grandmother   ? Stroke Paternal  Grandmother   ? Heart disease Paternal Grandfather   ? Stomach cancer Cousin   ? Cancer Cousin 81  ?     stomach cancer  ? Esophageal cancer Neg Hx   ? Rectal cancer Neg Hx   ? ? ?Review of Systems  ?Genitourinary:  Positive for pelvic pain (RLQ discomfort w/menses x3 months).  ?All other systems reviewed and are negative. ? ?Exam:   ?BP 110/76   Pulse 72   Ht '5\' 1"'$  (1.549 m)   Wt 134 lb (60.8 kg)   LMP 07/17/2021 (Exact Date)   SpO2 99%   BMI 25.32 kg/m?     ?General appearance: alert, cooperative and appears stated age ?Head: normocephalic, without obvious abnormality, atraumatic ?Neck: no adenopathy, supple, symmetrical, trachea midline and thyroid normal to inspection and palpation ?Lungs: clear to auscultation bilaterally ?Breasts: normal appearance, no masses or tenderness, No nipple retraction or dimpling, No nipple discharge or bleeding, No axillary  adenopathy ?Heart: regular rate and rhythm ?Abdomen: soft, non-tender; no masses, no organomegaly ?Extremities: extremities normal, atraumatic, no cyanosis or edema ?Skin: skin color, texture, turgor normal. No rashes or lesions ?Lymph nodes: cervical, supraclavicular, and axillary nodes normal. ?Neurologic: grossly normal ? ?Pelvic: External genitalia:  no lesions ?             No abnormal inguinal nodes palpated. ?             Urethra:  normal appearing urethra with no masses, tenderness or lesions ?             Bartholins and Skenes: normal    ?             Vagina: normal appearing vagina with normal color and discharge, no lesions ?             Cervix: no lesions ?             Pap taken: yes ?Bimanual Exam:  Uterus:  normal size, contour, position, consistency, mobility, non-tender ?             Adnexa: no mass, fullness, tenderness ?             Rectal exam: yes.  Confirms. ?             Anus:  normal sphincter tone, no lesions ? ?Chaperone was present for exam:  Estill Bamberg, CMA ? ?Assessment:   ?Well woman visit with gynecologic exam. ?Combined abdominal and pelvic pain.  RLQ pain. Right lateral pain.  ?Hx infertility.  ?FH cancers.  ? ?Plan: ?Mammogram screening discussed. ?Self breast awareness reviewed. ?Pap and HR HPV as above. ?Guidelines for Calcium, Vitamin D, regular exercise program including cardiovascular and weight bearing exercise. ?Will proceed with pelvic and abdominal ultrasound.  ?Follow up annually and prn.  ? ?After visit summary provided.  ? ? ? ?

## 2021-08-07 ENCOUNTER — Other Ambulatory Visit (HOSPITAL_COMMUNITY)
Admission: RE | Admit: 2021-08-07 | Discharge: 2021-08-07 | Disposition: A | Discharge status: Home / Self Care | Payer: 59 | Source: Ambulatory Visit | Attending: Obstetrics and Gynecology | Admitting: Obstetrics and Gynecology

## 2021-08-07 ENCOUNTER — Telehealth: Payer: Self-pay | Admitting: Obstetrics and Gynecology

## 2021-08-07 ENCOUNTER — Ambulatory Visit (INDEPENDENT_AMBULATORY_CARE_PROVIDER_SITE_OTHER): Payer: 59 | Admitting: Obstetrics and Gynecology

## 2021-08-07 ENCOUNTER — Encounter: Payer: Self-pay | Admitting: Obstetrics and Gynecology

## 2021-08-07 VITALS — BP 110/76 | HR 72 | Ht 61.0 in | Wt 134.0 lb

## 2021-08-07 DIAGNOSIS — R109 Unspecified abdominal pain: Secondary | ICD-10-CM

## 2021-08-07 DIAGNOSIS — Z124 Encounter for screening for malignant neoplasm of cervix: Secondary | ICD-10-CM | POA: Diagnosis present

## 2021-08-07 DIAGNOSIS — R1031 Right lower quadrant pain: Secondary | ICD-10-CM

## 2021-08-07 DIAGNOSIS — Z01419 Encounter for gynecological examination (general) (routine) without abnormal findings: Secondary | ICD-10-CM | POA: Diagnosis not present

## 2021-08-07 NOTE — Telephone Encounter (Signed)
Orders placed. Pt notified to call and schedule.  ?

## 2021-08-07 NOTE — Patient Instructions (Signed)

## 2021-08-07 NOTE — Telephone Encounter (Signed)
Please proceed with scheduling a pelvic and abdominal ultrasound at [facility] for combined abdominal and pelvic pain.  ?Patient had RLQ and right lateral abdominal pain.  ? ? ?

## 2021-08-08 LAB — CYTOLOGY - PAP
Comment: NEGATIVE
Diagnosis: NEGATIVE
High risk HPV: NEGATIVE

## 2021-08-08 NOTE — Telephone Encounter (Signed)
Thank you! Encounter reviewed and closed.  

## 2021-08-08 NOTE — Telephone Encounter (Signed)
Patient scheduled on 08/27/21 for ultrasounds.  ?

## 2021-08-27 ENCOUNTER — Other Ambulatory Visit: Payer: 59

## 2021-09-02 ENCOUNTER — Ambulatory Visit
Admission: RE | Admit: 2021-09-02 | Discharge: 2021-09-02 | Disposition: A | Discharge status: Home / Self Care | Payer: 59 | Source: Ambulatory Visit | Attending: Obstetrics and Gynecology | Admitting: Obstetrics and Gynecology

## 2021-09-02 ENCOUNTER — Other Ambulatory Visit: Payer: 59

## 2021-09-02 DIAGNOSIS — R109 Unspecified abdominal pain: Secondary | ICD-10-CM

## 2021-09-02 DIAGNOSIS — R1031 Right lower quadrant pain: Secondary | ICD-10-CM

## 2022-06-10 ENCOUNTER — Telehealth: Payer: Self-pay | Admitting: Nurse Practitioner

## 2022-06-10 NOTE — Telephone Encounter (Signed)
Pt is requesting to transfer to [facility]. Please advise.

## 2022-06-17 ENCOUNTER — Ambulatory Visit (INDEPENDENT_AMBULATORY_CARE_PROVIDER_SITE_OTHER): Payer: 59 | Admitting: Medical

## 2022-06-17 ENCOUNTER — Ambulatory Visit (HOSPITAL_BASED_OUTPATIENT_CLINIC_OR_DEPARTMENT_OTHER)
Admission: RE | Admit: 2022-06-17 | Discharge: 2022-06-17 | Disposition: A | Discharge status: Home / Self Care | Payer: 59 | Source: Ambulatory Visit | Attending: Medical | Admitting: Medical

## 2022-06-17 VITALS — BP 122/66 | HR 65 | Temp 98.0°F | Resp 18 | Ht 61.0 in | Wt 133.6 lb

## 2022-06-17 DIAGNOSIS — R5383 Other fatigue: Secondary | ICD-10-CM | POA: Diagnosis not present

## 2022-06-17 DIAGNOSIS — R1013 Epigastric pain: Secondary | ICD-10-CM

## 2022-06-17 DIAGNOSIS — R0781 Pleurodynia: Secondary | ICD-10-CM

## 2022-06-17 DIAGNOSIS — Z0000 Encounter for general adult medical examination without abnormal findings: Secondary | ICD-10-CM

## 2022-06-17 DIAGNOSIS — N926 Irregular menstruation, unspecified: Secondary | ICD-10-CM

## 2022-06-17 DIAGNOSIS — E559 Vitamin D deficiency, unspecified: Secondary | ICD-10-CM

## 2022-06-17 DIAGNOSIS — R232 Flushing: Secondary | ICD-10-CM

## 2022-06-17 LAB — FOLLICLE STIMULATING HORMONE: FSH: 18.2 m[IU]/mL

## 2022-06-17 LAB — CBC WITH DIFFERENTIAL/PLATELET
Basophils Absolute: 0.1 10*3/uL (ref 0.0–0.1)
Basophils Relative: 0.9 % (ref 0.0–3.0)
Eosinophils Absolute: 0.1 10*3/uL (ref 0.0–0.7)
Eosinophils Relative: 1 % (ref 0.0–5.0)
HCT: 43.4 % (ref 36.0–46.0)
Hemoglobin: 14.4 g/dL (ref 12.0–15.0)
Lymphocytes Relative: 43.5 % (ref 12.0–46.0)
Lymphs Abs: 2.5 10*3/uL (ref 0.7–4.0)
MCHC: 33.2 g/dL (ref 30.0–36.0)
MCV: 91.6 fl (ref 78.0–100.0)
Monocytes Absolute: 0.4 10*3/uL (ref 0.1–1.0)
Monocytes Relative: 6.8 % (ref 3.0–12.0)
Neutro Abs: 2.8 10*3/uL (ref 1.4–7.7)
Neutrophils Relative %: 47.8 % (ref 43.0–77.0)
Platelets: 363 10*3/uL (ref 150.0–400.0)
RBC: 4.74 Mil/uL (ref 3.87–5.11)
RDW: 12.9 % (ref 11.5–15.5)
WBC: 5.8 10*3/uL (ref 4.0–10.5)

## 2022-06-17 LAB — COMPREHENSIVE METABOLIC PANEL
ALT: 10 U/L (ref 0–35)
AST: 17 U/L (ref 0–37)
Albumin: 4.3 g/dL (ref 3.5–5.2)
Alkaline Phosphatase: 64 U/L (ref 39–117)
BUN: 12 mg/dL (ref 6–23)
CO2: 27 mEq/L (ref 19–32)
Calcium: 9.9 mg/dL (ref 8.4–10.5)
Chloride: 102 mEq/L (ref 96–112)
Creatinine, Ser: 0.83 mg/dL (ref 0.40–1.20)
GFR: 84 mL/min (ref >60.00)
Glucose, Bld: 83 mg/dL (ref 70–99)
Potassium: 5 mEq/L (ref 3.5–5.1)
Sodium: 138 mEq/L (ref 135–145)
Total Bilirubin: 0.7 mg/dL (ref 0.2–1.2)
Total Protein: 7.3 g/dL (ref 6.0–8.3)

## 2022-06-17 LAB — LIPID PANEL
Cholesterol: 266 mg/dL — ABNORMAL HIGH (ref 0–200)
HDL: 93.1 mg/dL (ref >39.00)
LDL Cholesterol: 160 mg/dL — ABNORMAL HIGH (ref 0–99)
NonHDL: 173
Total CHOL/HDL Ratio: 3
Triglycerides: 66 mg/dL (ref 0.0–149.0)
VLDL: 13.2 mg/dL (ref 0.0–40.0)

## 2022-06-17 LAB — TSH: TSH: 1.55 u[IU]/mL (ref 0.35–5.50)

## 2022-06-17 LAB — VITAMIN D 25 HYDROXY (VIT D DEFICIENCY, FRACTURES): VITD: 34.48 ng/mL (ref 30.00–100.00)

## 2022-06-17 LAB — IRON: Iron: 110 ug/dL (ref 42–145)

## 2022-06-17 LAB — VITAMIN B12: Vitamin B-12: >1500 pg/mL — ABNORMAL HIGH (ref 211–911)

## 2022-06-17 NOTE — Progress Notes (Signed)
Subjective:    Patient ID: Heidi Daniels, female    DOB: November 05, 1975, 47 y.o.   MRN: [id number]  HPI     Pt in [place]. [place]. Pt works for [employer]. Pt works out 4-5 times a week. Does body pump and orange theory. Moderate healthy diet(leans toward vegitarian diet at home). But will eat meat out on occasion when dining out.  Non smoker. No alcohol. 1-2 cups of coffee a day.   Pt thinks she may be premenopausal. She will see gynecologist upcoming. Irregular menstrual cycles. Rare potential hot flashes.  Pt states some pain in rt rib for 6 months. Pain comes and goes. Pain is intermittent. She has hard time explaining frequency.   Also low back pain for years with rt side si area pain- 4 years ago ortho evaluated. Per pt connection between hip and knee was week?  Up to date on tetanus- last week.  Pt has some daily fatigue. Not sleeping well.  Also history of low vitamin D.    Lmp- 2 days ago.    Review of Systems  Constitutional:  Positive for fatigue. Negative for chills and fever.  HENT:  Negative for congestion, drooling and hearing loss.   Respiratory:  Negative for cough, chest tightness and wheezing.   Cardiovascular:  Negative for chest pain and palpitations.  Gastrointestinal:  Negative for abdominal pain, constipation, nausea and vomiting.  Genitourinary:  Negative for dyspareunia and enuresis.  Musculoskeletal:  Negative for back pain and myalgias.       Rib pain lower rib area. And lumar/rt si area. See hpi.  Skin:  Negative for rash.  Neurological:  Negative for dizziness, seizures, syncope, weakness, light-headedness and headaches.  Hematological:  Negative for adenopathy. Does not bruise/bleed easily.  Psychiatric/Behavioral:  Negative for behavioral problems, confusion, hallucinations and sleep disturbance. The patient is not nervous/anxious.     Past Medical History:  Diagnosis Date   Hyperlipidemia      Social History   Socioeconomic History    Marital status: Married    Spouse name: Not on file   Number of children: Not on file   Years of education: Not on file   Highest education level: Not on file  Occupational History   Not on file  Tobacco Use   Smoking status: Never   Smokeless tobacco: Never  Vaping Use   Vaping Use: Never used  Substance and Sexual Activity   Alcohol use: Not Currently   Drug use: Never   Sexual activity: Yes    Birth control/protection: None  Other Topics Concern   Not on file  Social History Narrative   Not on file   Social Determinants of Health   Financial Resource Strain: Not on file  Food Insecurity: Not on file  Transportation Needs: Not on file  Physical Activity: Not on file  Stress: Not on file  Social Connections: Not on file  Intimate Partner Violence: Not on file    Past Surgical History:  Procedure Laterality Date   NO PAST SURGERIES      Family History  Problem Relation Age of Onset   Diabetes Mother    Osteoporosis Mother    Heart attack Father 49   Hyperlipidemia Father    Diabetes Father    Heart disease Father    Diabetes Brother    Hyperlipidemia Brother    Colon cancer Maternal Uncle    Cancer Maternal Uncle 40       colon   Breast  cancer Paternal Aunt    Cancer Paternal Aunt 14       breast cancer   Stroke Paternal Aunt 33   Cancer Paternal Uncle 54       prostate cancer   Hypertension Paternal Uncle    Stroke Paternal Uncle    Brain cancer Maternal Grandmother    Cancer Maternal Grandmother 71       brain cancer   Cancer Maternal Grandfather 87       lung cancer secondary to tobacco use   Anuerysm Paternal Grandmother    Stroke Paternal Grandmother    Heart disease Paternal Grandfather    Stomach cancer Cousin    Cancer Cousin 30       stomach cancer   Esophageal cancer Neg Hx    Rectal cancer Neg Hx     No Known Allergies  Current Outpatient Medications on File Prior to Visit  Medication Sig Dispense Refill   Cholecalciferol  (VITAMIN D3) 25 MCG (1000 UT) tablet Take 1,000 Units by mouth daily. Three times a week     omega-3 acid ethyl esters (LOVAZA) 1 g capsule Take by mouth 2 (two) times daily.     OVER THE COUNTER MEDICATION B 12 tablet, one tablet daily.     OVER THE COUNTER MEDICATION Co Q 10 one capsule daily.     OVER THE COUNTER MEDICATION Collagen 6 grams daily.     No current facility-administered medications on file prior to visit.    BP 122/66   Pulse 65   Temp 98 F (36.7 C)   Resp 18   Ht '5\' 1"'$  (1.549 m)   Wt 133 lb 9.6 oz (60.6 kg)   SpO2 100%   BMI 25.24 kg/m         Objective:   Physical Exam  General Mental Status- Alert. General Appearance- Not in acute distress.   Skin General: Color- Normal Color. Moisture- Normal Moisture.  Neck Carotid Arteries- Normal color. Moisture- Normal Moisture. No carotid bruits. No JVD.  Chest and Lung Exam Auscultation: Breath Sounds:-Normal.  Cardiovascular Auscultation:Rythm- Regular. Murmurs & Other Heart Sounds:Auscultation of the heart reveals- No Murmurs.  Abdomen Inspection:-Inspeection Normal. Palpation/Percussion:Note:No mass. Palpation and Percussion of the abdomen reveal- Non Tender, Non Distended + BS, no rebound or guarding.  Neurologic Cranial Nerve exam:- CN III-XII intact(No nystagmus), symmetric smile. Strength:- 5/5 equal and symmetric strength both upper and lower extremities.       Assessment & Plan:   Patient Instructions  For you wellness exam today I have ordered cbc, cmp and lipid panel.  Ask you give me date of recent tetanus vaccine. If was just tetanus, Td or tdap.  Recommend exercise and healthy diet.  We will let you know lab results as they come in.  Follow up date appointment will be determined after lab review.        Fatigue, unspecified type - B12 - Vitamin B1 - Iron - TSH  Vitamin D deficiency  - VITAMIN D 25 Hydroxy (Vit-D Deficiency, Fractures)  Irregular menses and  Hot  flashes - FSH     Rib pain on right side Get xray - DG Ribs Unilateral Right; Future   Lumbar back pain and sciatic intermittent.  For both rib and lower back/si pain if you want me to refer to sports med MD let me know.     Mackie Pai, PA-C

## 2022-06-17 NOTE — Patient Instructions (Addendum)
For you wellness exam today I have ordered cbc, cmp and lipid panel.  Ask you give me date of recent tetanus vaccine. If was just tetanus, Td or tdap.  Recommend exercise and healthy diet.  We will let you know lab results as they come in.  Follow up date appointment will be determined after lab review.        Fatigue, unspecified type - B12 - Vitamin B1 - Iron - TSH  Vitamin D deficiency  - VITAMIN D 25 Hydroxy (Vit-D Deficiency, Fractures)  Irregular menses and  Hot flashes - FSH     Rib pain on right side Get xray - DG Ribs Unilateral Right; Future   Lumbar back pain and sciatic intermittent.  For both rib and lower back/si pain if you want me to refer to sports med MD let me know.

## 2022-06-17 NOTE — Addendum Note (Signed)
Addended by: Anabel Halon on: 06/17/2022 12:16 PM   Modules accepted: Orders

## 2022-06-21 LAB — VITAMIN B1: Vitamin B1 (Thiamine): 7 nmol/L — ABNORMAL LOW (ref 8–30)

## 2022-08-10 ENCOUNTER — Other Ambulatory Visit: Payer: Self-pay | Admitting: Obstetrics and Gynecology

## 2022-08-10 DIAGNOSIS — Z1231 Encounter for screening mammogram for malignant neoplasm of breast: Secondary | ICD-10-CM

## 2022-08-25 ENCOUNTER — Ambulatory Visit
Admission: RE | Admit: 2022-08-25 | Discharge: 2022-08-25 | Disposition: A | Discharge status: Home / Self Care | Payer: 59 | Source: Ambulatory Visit | Attending: Obstetrics and Gynecology | Admitting: Obstetrics and Gynecology

## 2022-08-25 DIAGNOSIS — Z1231 Encounter for screening mammogram for malignant neoplasm of breast: Secondary | ICD-10-CM

## 2022-11-11 NOTE — Progress Notes (Unsigned)
47 y.o. G0P0000 Married Asian/[place]ian female here for annual exam.    PCP:     No LMP recorded.           Sexually active: {yes no:314532}  The current method of family planning is none.    Exercising: {yes no:314532}  {types:19826} Smoker:  no  Health Maintenance: Pap:  04-22-18 Neg:Neg HR HPV,2017 normal per patient in [place]  History of abnormal Pap:  no MMG:  08/25/22 Breast Density Cat C, BI-RADS CAT 1 neg Colonoscopy:  07/08/21 BMD:   n/a  Result  n/a TDaP:  declined with PCP Gardasil:   no HIV: neg in past Hep C: neg in past Screening Labs:  Hb today: ***, Urine today: ***   reports that she has never smoked. She has never used smokeless tobacco. She reports that she does not currently use alcohol. She reports that she does not use drugs.  Past Medical History:  Diagnosis Date   Hyperlipidemia     Past Surgical History:  Procedure Laterality Date   NO PAST SURGERIES      Current Outpatient Medications  Medication Sig Dispense Refill   Cholecalciferol (VITAMIN D3) 25 MCG (1000 UT) tablet Take 1,000 Units by mouth daily. Three times a week     omega-3 acid ethyl esters (LOVAZA) 1 g capsule Take by mouth 2 (two) times daily.     OVER THE COUNTER MEDICATION B 12 tablet, one tablet daily.     OVER THE COUNTER MEDICATION Co Q 10 one capsule daily.     OVER THE COUNTER MEDICATION Collagen 6 grams daily.     No current facility-administered medications for this visit.    Family History  Problem Relation Age of Onset   Diabetes Mother    Osteoporosis Mother    Heart attack Father 48   Hyperlipidemia Father    Diabetes Father    Heart disease Father    Diabetes Brother    Hyperlipidemia Brother    Colon cancer Maternal Uncle    Cancer Maternal Uncle 40       colon   Breast cancer Paternal Aunt    Cancer Paternal Aunt 21       breast cancer   Stroke Paternal Aunt 46   Cancer Paternal Uncle 57       prostate cancer   Hypertension Paternal Uncle    Stroke  Paternal Uncle    Brain cancer Maternal Grandmother    Cancer Maternal Grandmother 75       brain cancer   Cancer Maternal Grandfather 41       lung cancer secondary to tobacco use   Anuerysm Paternal Grandmother    Stroke Paternal Grandmother    Heart disease Paternal Grandfather    Stomach cancer Cousin    Cancer Cousin 30       stomach cancer   Esophageal cancer Neg Hx    Rectal cancer Neg Hx     Review of Systems  Exam:   There were no vitals taken for this visit.    General appearance: alert, cooperative and appears stated age Head: normocephalic, without obvious abnormality, atraumatic Neck: no adenopathy, supple, symmetrical, trachea midline and thyroid normal to inspection and palpation Lungs: clear to auscultation bilaterally Breasts: normal appearance, no masses or tenderness, No nipple retraction or dimpling, No nipple discharge or bleeding, No axillary adenopathy Heart: regular rate and rhythm Abdomen: soft, non-tender; no masses, no organomegaly Extremities: extremities normal, atraumatic, no cyanosis or edema Skin: skin color,  texture, turgor normal. No rashes or lesions Lymph nodes: cervical, supraclavicular, and axillary nodes normal. Neurologic: grossly normal  Pelvic: External genitalia:  no lesions              No abnormal inguinal nodes palpated.              Urethra:  normal appearing urethra with no masses, tenderness or lesions              Bartholins and Skenes: normal                 Vagina: normal appearing vagina with normal color and discharge, no lesions              Cervix: no lesions              Pap taken: {yes no:314532} Bimanual Exam:  Uterus:  normal size, contour, position, consistency, mobility, non-tender              Adnexa: no mass, fullness, tenderness              Rectal exam: {yes no:314532}.  Confirms.              Anus:  normal sphincter tone, no lesions  Chaperone was present for exam:  ***  Assessment:   Well woman visit  with gynecologic exam.   Plan: Mammogram screening discussed. Self breast awareness reviewed. Pap and HR HPV as above. Guidelines for Calcium, Vitamin D, regular exercise program including cardiovascular and weight bearing exercise.   Follow up annually and prn.   Additional counseling given.  {yes [id number]. _______ minutes face to face time of which over 50% was spent in counseling.    After visit summary provided.

## 2022-11-12 ENCOUNTER — Encounter: Payer: Self-pay | Admitting: Obstetrics and Gynecology

## 2022-11-12 ENCOUNTER — Ambulatory Visit (INDEPENDENT_AMBULATORY_CARE_PROVIDER_SITE_OTHER): Payer: 59 | Admitting: Obstetrics and Gynecology

## 2022-11-12 VITALS — BP 102/62 | HR 66 | Ht 61.25 in | Wt 134.0 lb

## 2022-11-12 DIAGNOSIS — R5383 Other fatigue: Secondary | ICD-10-CM | POA: Diagnosis not present

## 2022-11-12 DIAGNOSIS — Z01419 Encounter for gynecological examination (general) (routine) without abnormal findings: Secondary | ICD-10-CM | POA: Diagnosis not present

## 2022-11-12 DIAGNOSIS — E7800 Pure hypercholesterolemia, unspecified: Secondary | ICD-10-CM | POA: Diagnosis not present

## 2022-11-12 NOTE — Patient Instructions (Signed)

## 2023-08-20 ENCOUNTER — Other Ambulatory Visit: Payer: Self-pay | Admitting: Obstetrics and Gynecology

## 2023-08-20 DIAGNOSIS — Z1231 Encounter for screening mammogram for malignant neoplasm of breast: Secondary | ICD-10-CM

## 2023-09-07 ENCOUNTER — Ambulatory Visit: Payer: Self-pay

## 2023-09-09 ENCOUNTER — Ambulatory Visit
Admission: RE | Admit: 2023-09-09 | Discharge: 2023-09-09 | Disposition: A | Discharge status: Home / Self Care | Payer: Self-pay | Source: Ambulatory Visit | Attending: Obstetrics and Gynecology | Admitting: Obstetrics and Gynecology

## 2023-09-09 DIAGNOSIS — Z1231 Encounter for screening mammogram for malignant neoplasm of breast: Secondary | ICD-10-CM

## 2023-09-15 ENCOUNTER — Ambulatory Visit: Payer: Self-pay | Admitting: Obstetrics and Gynecology

## 2023-11-15 ENCOUNTER — Encounter: Payer: Self-pay | Admitting: Medical

## 2023-12-27 ENCOUNTER — Other Ambulatory Visit: Payer: Self-pay | Admitting: Obstetrics and Gynecology

## 2023-12-27 DIAGNOSIS — R92343 Mammographic extreme density, bilateral breasts: Secondary | ICD-10-CM

## 2024-02-22 ENCOUNTER — Ambulatory Visit
Admission: RE | Admit: 2024-02-22 | Discharge: 2024-02-22 | Disposition: A | Discharge status: Home / Self Care | Source: Ambulatory Visit | Attending: Obstetrics and Gynecology | Admitting: Obstetrics and Gynecology

## 2024-02-22 DIAGNOSIS — R92343 Mammographic extreme density, bilateral breasts: Secondary | ICD-10-CM

## 2024-02-22 MED ORDER — GADOPICLENOL 0.5 MMOL/ML IV SOLN
6.0000 mL | Freq: Once | INTRAVENOUS | Status: AC | PRN
  Administered 2024-02-22: 6 mL via INTRAVENOUS
